# Patient Record
Sex: Male | Born: 1950
Health system: Southern US, Community
[De-identification: ages and names within clinical notes are randomized; demographics above are authoritative.]

## PROBLEM LIST (undated history)

## (undated) HISTORY — PX: COLON SURGERY: SHX602

## (undated) HISTORY — PX: FRACTURE SURGERY: SHX138

---

## 2015-04-14 DIAGNOSIS — M75102 Unspecified rotator cuff tear or rupture of left shoulder, not specified as traumatic: Secondary | ICD-10-CM | POA: Diagnosis not present

## 2015-04-19 DIAGNOSIS — Z8249 Family history of ischemic heart disease and other diseases of the circulatory system: Secondary | ICD-10-CM | POA: Diagnosis not present

## 2015-04-19 DIAGNOSIS — K219 Gastro-esophageal reflux disease without esophagitis: Secondary | ICD-10-CM | POA: Diagnosis present

## 2015-04-19 DIAGNOSIS — N400 Enlarged prostate without lower urinary tract symptoms: Secondary | ICD-10-CM | POA: Diagnosis present

## 2015-04-19 DIAGNOSIS — F319 Bipolar disorder, unspecified: Secondary | ICD-10-CM | POA: Diagnosis not present

## 2015-04-19 DIAGNOSIS — Z79899 Other long term (current) drug therapy: Secondary | ICD-10-CM | POA: Diagnosis not present

## 2015-04-19 DIAGNOSIS — Z79891 Long term (current) use of opiate analgesic: Secondary | ICD-10-CM | POA: Diagnosis not present

## 2015-04-19 DIAGNOSIS — K5720 Diverticulitis of large intestine with perforation and abscess without bleeding: Secondary | ICD-10-CM | POA: Diagnosis present

## 2015-04-19 DIAGNOSIS — F419 Anxiety disorder, unspecified: Secondary | ICD-10-CM | POA: Diagnosis present

## 2015-04-19 DIAGNOSIS — Z880 Allergy status to penicillin: Secondary | ICD-10-CM | POA: Diagnosis not present

## 2015-04-19 DIAGNOSIS — K5732 Diverticulitis of large intestine without perforation or abscess without bleeding: Secondary | ICD-10-CM | POA: Diagnosis not present

## 2015-04-19 DIAGNOSIS — F17220 Nicotine dependence, chewing tobacco, uncomplicated: Secondary | ICD-10-CM | POA: Diagnosis present

## 2015-04-19 DIAGNOSIS — R1031 Right lower quadrant pain: Secondary | ICD-10-CM | POA: Diagnosis not present

## 2015-04-19 DIAGNOSIS — Z7982 Long term (current) use of aspirin: Secondary | ICD-10-CM | POA: Diagnosis not present

## 2015-04-19 DIAGNOSIS — M47897 Other spondylosis, lumbosacral region: Secondary | ICD-10-CM | POA: Diagnosis present

## 2015-04-19 DIAGNOSIS — G4730 Sleep apnea, unspecified: Secondary | ICD-10-CM | POA: Diagnosis present

## 2015-04-24 DIAGNOSIS — K5720 Diverticulitis of large intestine with perforation and abscess without bleeding: Secondary | ICD-10-CM | POA: Diagnosis not present

## 2015-04-24 DIAGNOSIS — K5730 Diverticulosis of large intestine without perforation or abscess without bleeding: Secondary | ICD-10-CM | POA: Diagnosis not present

## 2015-04-25 DIAGNOSIS — K5720 Diverticulitis of large intestine with perforation and abscess without bleeding: Secondary | ICD-10-CM | POA: Diagnosis not present

## 2015-04-25 DIAGNOSIS — K5730 Diverticulosis of large intestine without perforation or abscess without bleeding: Secondary | ICD-10-CM | POA: Diagnosis not present

## 2015-04-26 DIAGNOSIS — K5730 Diverticulosis of large intestine without perforation or abscess without bleeding: Secondary | ICD-10-CM | POA: Diagnosis not present

## 2015-04-26 DIAGNOSIS — K5720 Diverticulitis of large intestine with perforation and abscess without bleeding: Secondary | ICD-10-CM | POA: Diagnosis not present

## 2015-04-27 DIAGNOSIS — K5720 Diverticulitis of large intestine with perforation and abscess without bleeding: Secondary | ICD-10-CM | POA: Diagnosis not present

## 2015-04-27 DIAGNOSIS — K5730 Diverticulosis of large intestine without perforation or abscess without bleeding: Secondary | ICD-10-CM | POA: Diagnosis not present

## 2015-04-28 DIAGNOSIS — K5730 Diverticulosis of large intestine without perforation or abscess without bleeding: Secondary | ICD-10-CM | POA: Diagnosis not present

## 2015-04-28 DIAGNOSIS — K5720 Diverticulitis of large intestine with perforation and abscess without bleeding: Secondary | ICD-10-CM | POA: Diagnosis not present

## 2015-04-29 DIAGNOSIS — K631 Perforation of intestine (nontraumatic): Secondary | ICD-10-CM | POA: Diagnosis not present

## 2015-04-29 DIAGNOSIS — K651 Peritoneal abscess: Secondary | ICD-10-CM | POA: Diagnosis not present

## 2015-04-30 DIAGNOSIS — Z09 Encounter for follow-up examination after completed treatment for conditions other than malignant neoplasm: Secondary | ICD-10-CM | POA: Diagnosis not present

## 2015-04-30 DIAGNOSIS — E7800 Pure hypercholesterolemia, unspecified: Secondary | ICD-10-CM | POA: Diagnosis not present

## 2015-04-30 DIAGNOSIS — K5720 Diverticulitis of large intestine with perforation and abscess without bleeding: Secondary | ICD-10-CM | POA: Diagnosis not present

## 2015-04-30 DIAGNOSIS — K5730 Diverticulosis of large intestine without perforation or abscess without bleeding: Secondary | ICD-10-CM | POA: Diagnosis not present

## 2015-04-30 DIAGNOSIS — I10 Essential (primary) hypertension: Secondary | ICD-10-CM | POA: Diagnosis not present

## 2015-05-04 DIAGNOSIS — K5720 Diverticulitis of large intestine with perforation and abscess without bleeding: Secondary | ICD-10-CM | POA: Diagnosis not present

## 2015-05-13 DIAGNOSIS — M47816 Spondylosis without myelopathy or radiculopathy, lumbar region: Secondary | ICD-10-CM | POA: Diagnosis not present

## 2015-05-13 DIAGNOSIS — Z79899 Other long term (current) drug therapy: Secondary | ICD-10-CM | POA: Diagnosis not present

## 2015-05-13 DIAGNOSIS — M25511 Pain in right shoulder: Secondary | ICD-10-CM | POA: Diagnosis not present

## 2015-05-13 DIAGNOSIS — M533 Sacrococcygeal disorders, not elsewhere classified: Secondary | ICD-10-CM | POA: Diagnosis not present

## 2015-05-13 DIAGNOSIS — G8929 Other chronic pain: Secondary | ICD-10-CM | POA: Diagnosis not present

## 2015-05-13 DIAGNOSIS — Z5181 Encounter for therapeutic drug level monitoring: Secondary | ICD-10-CM | POA: Diagnosis not present

## 2015-05-14 DIAGNOSIS — K5732 Diverticulitis of large intestine without perforation or abscess without bleeding: Secondary | ICD-10-CM | POA: Diagnosis not present

## 2015-05-14 DIAGNOSIS — K4020 Bilateral inguinal hernia, without obstruction or gangrene, not specified as recurrent: Secondary | ICD-10-CM | POA: Diagnosis not present

## 2015-05-14 DIAGNOSIS — K5720 Diverticulitis of large intestine with perforation and abscess without bleeding: Secondary | ICD-10-CM | POA: Diagnosis not present

## 2015-05-20 DIAGNOSIS — K5720 Diverticulitis of large intestine with perforation and abscess without bleeding: Secondary | ICD-10-CM | POA: Diagnosis not present

## 2015-05-25 DIAGNOSIS — Z79899 Other long term (current) drug therapy: Secondary | ICD-10-CM | POA: Diagnosis not present

## 2015-05-25 DIAGNOSIS — N400 Enlarged prostate without lower urinary tract symptoms: Secondary | ICD-10-CM | POA: Diagnosis not present

## 2015-05-25 DIAGNOSIS — K5732 Diverticulitis of large intestine without perforation or abscess without bleeding: Secondary | ICD-10-CM | POA: Diagnosis not present

## 2015-05-25 DIAGNOSIS — F419 Anxiety disorder, unspecified: Secondary | ICD-10-CM | POA: Diagnosis not present

## 2015-05-25 DIAGNOSIS — Z880 Allergy status to penicillin: Secondary | ICD-10-CM | POA: Diagnosis not present

## 2015-05-25 DIAGNOSIS — K5720 Diverticulitis of large intestine with perforation and abscess without bleeding: Secondary | ICD-10-CM | POA: Diagnosis not present

## 2015-05-25 DIAGNOSIS — K219 Gastro-esophageal reflux disease without esophagitis: Secondary | ICD-10-CM | POA: Diagnosis not present

## 2015-05-25 DIAGNOSIS — F17290 Nicotine dependence, other tobacco product, uncomplicated: Secondary | ICD-10-CM | POA: Diagnosis not present

## 2015-05-26 DIAGNOSIS — F3341 Major depressive disorder, recurrent, in partial remission: Secondary | ICD-10-CM | POA: Diagnosis not present

## 2015-05-28 DIAGNOSIS — K5730 Diverticulosis of large intestine without perforation or abscess without bleeding: Secondary | ICD-10-CM | POA: Diagnosis not present

## 2015-05-28 DIAGNOSIS — Z720 Tobacco use: Secondary | ICD-10-CM | POA: Diagnosis not present

## 2015-05-28 DIAGNOSIS — L739 Follicular disorder, unspecified: Secondary | ICD-10-CM | POA: Diagnosis not present

## 2015-05-28 DIAGNOSIS — M545 Low back pain: Secondary | ICD-10-CM | POA: Diagnosis not present

## 2015-06-02 DIAGNOSIS — N529 Male erectile dysfunction, unspecified: Secondary | ICD-10-CM | POA: Diagnosis not present

## 2015-06-03 DIAGNOSIS — R6889 Other general symptoms and signs: Secondary | ICD-10-CM | POA: Diagnosis not present

## 2015-06-09 DIAGNOSIS — E291 Testicular hypofunction: Secondary | ICD-10-CM | POA: Diagnosis not present

## 2015-06-09 DIAGNOSIS — J209 Acute bronchitis, unspecified: Secondary | ICD-10-CM | POA: Diagnosis not present

## 2015-06-16 DIAGNOSIS — F5221 Male erectile disorder: Secondary | ICD-10-CM | POA: Diagnosis not present

## 2015-06-23 DIAGNOSIS — J069 Acute upper respiratory infection, unspecified: Secondary | ICD-10-CM | POA: Diagnosis not present

## 2015-06-23 DIAGNOSIS — M1990 Unspecified osteoarthritis, unspecified site: Secondary | ICD-10-CM | POA: Diagnosis not present

## 2015-06-23 DIAGNOSIS — M545 Low back pain: Secondary | ICD-10-CM | POA: Diagnosis not present

## 2015-06-23 DIAGNOSIS — F5221 Male erectile disorder: Secondary | ICD-10-CM | POA: Diagnosis not present

## 2015-07-07 DIAGNOSIS — N529 Male erectile dysfunction, unspecified: Secondary | ICD-10-CM | POA: Diagnosis not present

## 2015-07-13 DIAGNOSIS — M533 Sacrococcygeal disorders, not elsewhere classified: Secondary | ICD-10-CM | POA: Diagnosis not present

## 2015-07-21 DIAGNOSIS — N529 Male erectile dysfunction, unspecified: Secondary | ICD-10-CM | POA: Diagnosis not present

## 2015-07-29 DIAGNOSIS — R1030 Lower abdominal pain, unspecified: Secondary | ICD-10-CM | POA: Diagnosis not present

## 2015-07-29 DIAGNOSIS — Z79899 Other long term (current) drug therapy: Secondary | ICD-10-CM | POA: Diagnosis not present

## 2015-07-29 DIAGNOSIS — K5732 Diverticulitis of large intestine without perforation or abscess without bleeding: Secondary | ICD-10-CM | POA: Diagnosis not present

## 2015-07-29 DIAGNOSIS — F419 Anxiety disorder, unspecified: Secondary | ICD-10-CM | POA: Diagnosis not present

## 2015-07-29 DIAGNOSIS — K219 Gastro-esophageal reflux disease without esophagitis: Secondary | ICD-10-CM | POA: Diagnosis not present

## 2015-07-29 DIAGNOSIS — Z7982 Long term (current) use of aspirin: Secondary | ICD-10-CM | POA: Diagnosis not present

## 2015-07-29 DIAGNOSIS — X58XXXA Exposure to other specified factors, initial encounter: Secondary | ICD-10-CM | POA: Diagnosis not present

## 2015-07-29 DIAGNOSIS — S39011A Strain of muscle, fascia and tendon of abdomen, initial encounter: Secondary | ICD-10-CM | POA: Diagnosis not present

## 2015-07-29 DIAGNOSIS — R1084 Generalized abdominal pain: Secondary | ICD-10-CM | POA: Diagnosis not present

## 2015-07-31 DIAGNOSIS — Z7982 Long term (current) use of aspirin: Secondary | ICD-10-CM | POA: Diagnosis not present

## 2015-07-31 DIAGNOSIS — R10814 Left lower quadrant abdominal tenderness: Secondary | ICD-10-CM | POA: Diagnosis not present

## 2015-07-31 DIAGNOSIS — R1030 Lower abdominal pain, unspecified: Secondary | ICD-10-CM | POA: Diagnosis not present

## 2015-07-31 DIAGNOSIS — F419 Anxiety disorder, unspecified: Secondary | ICD-10-CM | POA: Diagnosis not present

## 2015-07-31 DIAGNOSIS — S39011A Strain of muscle, fascia and tendon of abdomen, initial encounter: Secondary | ICD-10-CM | POA: Diagnosis not present

## 2015-07-31 DIAGNOSIS — S39011D Strain of muscle, fascia and tendon of abdomen, subsequent encounter: Secondary | ICD-10-CM | POA: Diagnosis not present

## 2015-07-31 DIAGNOSIS — K2970 Gastritis, unspecified, without bleeding: Secondary | ICD-10-CM | POA: Diagnosis not present

## 2015-07-31 DIAGNOSIS — Z79899 Other long term (current) drug therapy: Secondary | ICD-10-CM | POA: Diagnosis not present

## 2015-07-31 DIAGNOSIS — X501XXD Overexertion from prolonged static or awkward postures, subsequent encounter: Secondary | ICD-10-CM | POA: Diagnosis not present

## 2015-07-31 DIAGNOSIS — K219 Gastro-esophageal reflux disease without esophagitis: Secondary | ICD-10-CM | POA: Diagnosis not present

## 2015-08-03 DIAGNOSIS — Z299 Encounter for prophylactic measures, unspecified: Secondary | ICD-10-CM | POA: Diagnosis not present

## 2015-08-03 DIAGNOSIS — K5792 Diverticulitis of intestine, part unspecified, without perforation or abscess without bleeding: Secondary | ICD-10-CM | POA: Diagnosis not present

## 2015-08-03 DIAGNOSIS — R1032 Left lower quadrant pain: Secondary | ICD-10-CM | POA: Diagnosis not present

## 2015-08-03 DIAGNOSIS — E291 Testicular hypofunction: Secondary | ICD-10-CM | POA: Diagnosis not present

## 2015-08-11 DIAGNOSIS — E291 Testicular hypofunction: Secondary | ICD-10-CM | POA: Diagnosis not present

## 2015-08-17 DIAGNOSIS — E7800 Pure hypercholesterolemia, unspecified: Secondary | ICD-10-CM | POA: Diagnosis not present

## 2015-08-17 DIAGNOSIS — I10 Essential (primary) hypertension: Secondary | ICD-10-CM | POA: Diagnosis not present

## 2015-08-17 DIAGNOSIS — M159 Polyosteoarthritis, unspecified: Secondary | ICD-10-CM | POA: Diagnosis not present

## 2015-08-18 DIAGNOSIS — F3341 Major depressive disorder, recurrent, in partial remission: Secondary | ICD-10-CM | POA: Diagnosis not present

## 2015-08-18 DIAGNOSIS — E291 Testicular hypofunction: Secondary | ICD-10-CM | POA: Diagnosis not present

## 2015-08-25 DIAGNOSIS — E291 Testicular hypofunction: Secondary | ICD-10-CM | POA: Diagnosis not present

## 2015-09-01 DIAGNOSIS — E291 Testicular hypofunction: Secondary | ICD-10-CM | POA: Diagnosis not present

## 2015-09-15 DIAGNOSIS — E291 Testicular hypofunction: Secondary | ICD-10-CM | POA: Diagnosis not present

## 2015-09-21 DIAGNOSIS — M159 Polyosteoarthritis, unspecified: Secondary | ICD-10-CM | POA: Diagnosis not present

## 2015-09-21 DIAGNOSIS — E7800 Pure hypercholesterolemia, unspecified: Secondary | ICD-10-CM | POA: Diagnosis not present

## 2015-09-21 DIAGNOSIS — I10 Essential (primary) hypertension: Secondary | ICD-10-CM | POA: Diagnosis not present

## 2015-09-29 DIAGNOSIS — K219 Gastro-esophageal reflux disease without esophagitis: Secondary | ICD-10-CM | POA: Diagnosis not present

## 2015-09-29 DIAGNOSIS — M1990 Unspecified osteoarthritis, unspecified site: Secondary | ICD-10-CM | POA: Diagnosis not present

## 2015-09-29 DIAGNOSIS — E291 Testicular hypofunction: Secondary | ICD-10-CM | POA: Diagnosis not present

## 2015-09-29 DIAGNOSIS — L739 Follicular disorder, unspecified: Secondary | ICD-10-CM | POA: Diagnosis not present

## 2015-09-29 DIAGNOSIS — B356 Tinea cruris: Secondary | ICD-10-CM | POA: Diagnosis not present

## 2015-10-07 DIAGNOSIS — K5732 Diverticulitis of large intestine without perforation or abscess without bleeding: Secondary | ICD-10-CM | POA: Diagnosis present

## 2015-10-07 DIAGNOSIS — Z7982 Long term (current) use of aspirin: Secondary | ICD-10-CM | POA: Diagnosis not present

## 2015-10-07 DIAGNOSIS — D72829 Elevated white blood cell count, unspecified: Secondary | ICD-10-CM | POA: Diagnosis present

## 2015-10-07 DIAGNOSIS — Z880 Allergy status to penicillin: Secondary | ICD-10-CM | POA: Diagnosis not present

## 2015-10-07 DIAGNOSIS — Z8701 Personal history of pneumonia (recurrent): Secondary | ICD-10-CM | POA: Diagnosis not present

## 2015-10-07 DIAGNOSIS — K632 Fistula of intestine: Secondary | ICD-10-CM | POA: Diagnosis present

## 2015-10-07 DIAGNOSIS — R1084 Generalized abdominal pain: Secondary | ICD-10-CM | POA: Diagnosis not present

## 2015-10-07 DIAGNOSIS — M47897 Other spondylosis, lumbosacral region: Secondary | ICD-10-CM | POA: Diagnosis present

## 2015-10-07 DIAGNOSIS — G4730 Sleep apnea, unspecified: Secondary | ICD-10-CM | POA: Diagnosis not present

## 2015-10-07 DIAGNOSIS — F319 Bipolar disorder, unspecified: Secondary | ICD-10-CM | POA: Diagnosis present

## 2015-10-07 DIAGNOSIS — K760 Fatty (change of) liver, not elsewhere classified: Secondary | ICD-10-CM | POA: Diagnosis not present

## 2015-10-07 DIAGNOSIS — Z79899 Other long term (current) drug therapy: Secondary | ICD-10-CM | POA: Diagnosis not present

## 2015-10-07 DIAGNOSIS — Z79891 Long term (current) use of opiate analgesic: Secondary | ICD-10-CM | POA: Diagnosis not present

## 2015-10-07 DIAGNOSIS — R112 Nausea with vomiting, unspecified: Secondary | ICD-10-CM | POA: Diagnosis not present

## 2015-10-07 DIAGNOSIS — K5730 Diverticulosis of large intestine without perforation or abscess without bleeding: Secondary | ICD-10-CM | POA: Diagnosis not present

## 2015-10-07 DIAGNOSIS — F17220 Nicotine dependence, chewing tobacco, uncomplicated: Secondary | ICD-10-CM | POA: Diagnosis present

## 2015-10-07 DIAGNOSIS — F17200 Nicotine dependence, unspecified, uncomplicated: Secondary | ICD-10-CM | POA: Diagnosis not present

## 2015-10-16 DIAGNOSIS — E291 Testicular hypofunction: Secondary | ICD-10-CM | POA: Diagnosis not present

## 2015-10-16 DIAGNOSIS — Z09 Encounter for follow-up examination after completed treatment for conditions other than malignant neoplasm: Secondary | ICD-10-CM | POA: Diagnosis not present

## 2015-10-16 DIAGNOSIS — Z299 Encounter for prophylactic measures, unspecified: Secondary | ICD-10-CM | POA: Diagnosis not present

## 2015-10-16 DIAGNOSIS — K5792 Diverticulitis of intestine, part unspecified, without perforation or abscess without bleeding: Secondary | ICD-10-CM | POA: Diagnosis not present

## 2015-10-20 DIAGNOSIS — K5732 Diverticulitis of large intestine without perforation or abscess without bleeding: Secondary | ICD-10-CM | POA: Diagnosis not present

## 2015-10-20 DIAGNOSIS — E291 Testicular hypofunction: Secondary | ICD-10-CM | POA: Diagnosis not present

## 2015-10-27 DIAGNOSIS — K5732 Diverticulitis of large intestine without perforation or abscess without bleeding: Secondary | ICD-10-CM | POA: Diagnosis not present

## 2015-10-27 DIAGNOSIS — Z6826 Body mass index (BMI) 26.0-26.9, adult: Secondary | ICD-10-CM | POA: Diagnosis not present

## 2015-10-27 DIAGNOSIS — M533 Sacrococcygeal disorders, not elsewhere classified: Secondary | ICD-10-CM | POA: Diagnosis not present

## 2015-10-27 DIAGNOSIS — Z713 Dietary counseling and surveillance: Secondary | ICD-10-CM | POA: Diagnosis not present

## 2015-10-27 DIAGNOSIS — Z299 Encounter for prophylactic measures, unspecified: Secondary | ICD-10-CM | POA: Diagnosis not present

## 2015-11-03 DIAGNOSIS — F319 Bipolar disorder, unspecified: Secondary | ICD-10-CM | POA: Diagnosis not present

## 2015-11-03 DIAGNOSIS — J449 Chronic obstructive pulmonary disease, unspecified: Secondary | ICD-10-CM | POA: Diagnosis not present

## 2015-11-03 DIAGNOSIS — K5732 Diverticulitis of large intestine without perforation or abscess without bleeding: Secondary | ICD-10-CM | POA: Diagnosis not present

## 2015-11-09 DIAGNOSIS — F3341 Major depressive disorder, recurrent, in partial remission: Secondary | ICD-10-CM | POA: Diagnosis not present

## 2015-11-17 DIAGNOSIS — E291 Testicular hypofunction: Secondary | ICD-10-CM | POA: Diagnosis not present

## 2015-12-01 DIAGNOSIS — E291 Testicular hypofunction: Secondary | ICD-10-CM | POA: Diagnosis not present

## 2015-12-15 DIAGNOSIS — E291 Testicular hypofunction: Secondary | ICD-10-CM | POA: Diagnosis not present

## 2015-12-29 DIAGNOSIS — K5720 Diverticulitis of large intestine with perforation and abscess without bleeding: Secondary | ICD-10-CM | POA: Diagnosis not present

## 2015-12-29 DIAGNOSIS — K912 Postsurgical malabsorption, not elsewhere classified: Secondary | ICD-10-CM | POA: Diagnosis not present

## 2015-12-29 DIAGNOSIS — E291 Testicular hypofunction: Secondary | ICD-10-CM | POA: Diagnosis not present

## 2015-12-30 DIAGNOSIS — E291 Testicular hypofunction: Secondary | ICD-10-CM | POA: Diagnosis not present

## 2016-01-05 DIAGNOSIS — Z6828 Body mass index (BMI) 28.0-28.9, adult: Secondary | ICD-10-CM | POA: Diagnosis not present

## 2016-01-05 DIAGNOSIS — Z713 Dietary counseling and surveillance: Secondary | ICD-10-CM | POA: Diagnosis not present

## 2016-01-05 DIAGNOSIS — F17200 Nicotine dependence, unspecified, uncomplicated: Secondary | ICD-10-CM | POA: Diagnosis not present

## 2016-01-05 DIAGNOSIS — Z720 Tobacco use: Secondary | ICD-10-CM | POA: Diagnosis not present

## 2016-01-05 DIAGNOSIS — J069 Acute upper respiratory infection, unspecified: Secondary | ICD-10-CM | POA: Diagnosis not present

## 2016-01-12 DIAGNOSIS — E291 Testicular hypofunction: Secondary | ICD-10-CM | POA: Diagnosis not present

## 2016-01-13 DIAGNOSIS — G4733 Obstructive sleep apnea (adult) (pediatric): Secondary | ICD-10-CM | POA: Diagnosis not present

## 2016-01-13 DIAGNOSIS — K5720 Diverticulitis of large intestine with perforation and abscess without bleeding: Secondary | ICD-10-CM | POA: Diagnosis not present

## 2016-01-13 DIAGNOSIS — K660 Peritoneal adhesions (postprocedural) (postinfection): Secondary | ICD-10-CM | POA: Diagnosis not present

## 2016-01-13 DIAGNOSIS — G8929 Other chronic pain: Secondary | ICD-10-CM | POA: Diagnosis not present

## 2016-01-13 DIAGNOSIS — M549 Dorsalgia, unspecified: Secondary | ICD-10-CM | POA: Diagnosis not present

## 2016-01-13 DIAGNOSIS — K219 Gastro-esophageal reflux disease without esophagitis: Secondary | ICD-10-CM | POA: Diagnosis not present

## 2016-01-13 DIAGNOSIS — Z79899 Other long term (current) drug therapy: Secondary | ICD-10-CM | POA: Diagnosis not present

## 2016-01-13 DIAGNOSIS — Z7982 Long term (current) use of aspirin: Secondary | ICD-10-CM | POA: Diagnosis not present

## 2016-01-20 DIAGNOSIS — Z79899 Other long term (current) drug therapy: Secondary | ICD-10-CM | POA: Diagnosis not present

## 2016-01-20 DIAGNOSIS — Z5181 Encounter for therapeutic drug level monitoring: Secondary | ICD-10-CM | POA: Diagnosis not present

## 2016-01-20 DIAGNOSIS — M47816 Spondylosis without myelopathy or radiculopathy, lumbar region: Secondary | ICD-10-CM | POA: Diagnosis not present

## 2016-01-20 DIAGNOSIS — M25511 Pain in right shoulder: Secondary | ICD-10-CM | POA: Diagnosis not present

## 2016-01-20 DIAGNOSIS — M533 Sacrococcygeal disorders, not elsewhere classified: Secondary | ICD-10-CM | POA: Diagnosis not present

## 2016-01-20 DIAGNOSIS — G894 Chronic pain syndrome: Secondary | ICD-10-CM | POA: Diagnosis not present

## 2016-01-25 DIAGNOSIS — M549 Dorsalgia, unspecified: Secondary | ICD-10-CM | POA: Diagnosis present

## 2016-01-25 DIAGNOSIS — G4733 Obstructive sleep apnea (adult) (pediatric): Secondary | ICD-10-CM | POA: Diagnosis present

## 2016-01-25 DIAGNOSIS — R590 Localized enlarged lymph nodes: Secondary | ICD-10-CM | POA: Diagnosis not present

## 2016-01-25 DIAGNOSIS — K219 Gastro-esophageal reflux disease without esophagitis: Secondary | ICD-10-CM | POA: Diagnosis present

## 2016-01-25 DIAGNOSIS — Z79899 Other long term (current) drug therapy: Secondary | ICD-10-CM | POA: Diagnosis not present

## 2016-01-25 DIAGNOSIS — G8929 Other chronic pain: Secondary | ICD-10-CM | POA: Diagnosis present

## 2016-01-25 DIAGNOSIS — K660 Peritoneal adhesions (postprocedural) (postinfection): Secondary | ICD-10-CM | POA: Diagnosis present

## 2016-01-25 DIAGNOSIS — G8918 Other acute postprocedural pain: Secondary | ICD-10-CM | POA: Diagnosis not present

## 2016-01-25 DIAGNOSIS — K5720 Diverticulitis of large intestine with perforation and abscess without bleeding: Secondary | ICD-10-CM | POA: Diagnosis present

## 2016-01-25 DIAGNOSIS — Z7982 Long term (current) use of aspirin: Secondary | ICD-10-CM | POA: Diagnosis not present

## 2016-02-10 DIAGNOSIS — E291 Testicular hypofunction: Secondary | ICD-10-CM | POA: Diagnosis not present

## 2016-02-11 ENCOUNTER — Emergency Department (HOSPITAL_COMMUNITY): Admission: EM | Admit: 2016-02-11 | Payer: Self-pay | Source: Home / Self Care

## 2016-02-11 ENCOUNTER — Emergency Department (HOSPITAL_COMMUNITY): Payer: Medicare Other

## 2016-02-11 ENCOUNTER — Encounter (HOSPITAL_COMMUNITY): Payer: Self-pay | Admitting: Emergency Medicine

## 2016-02-11 ENCOUNTER — Other Ambulatory Visit (HOSPITAL_COMMUNITY): Payer: Self-pay | Admitting: Pharmacy Technician

## 2016-02-11 ENCOUNTER — Emergency Department (HOSPITAL_COMMUNITY)
Admission: EM | Admit: 2016-02-11 | Discharge: 2016-02-11 | Disposition: A | Discharge status: Home / Self Care | Payer: Medicare Other | Attending: Emergency Medicine | Admitting: Emergency Medicine

## 2016-02-11 DIAGNOSIS — Y92410 Unspecified street and highway as the place of occurrence of the external cause: Secondary | ICD-10-CM | POA: Insufficient documentation

## 2016-02-11 DIAGNOSIS — R079 Chest pain, unspecified: Secondary | ICD-10-CM

## 2016-02-11 DIAGNOSIS — Y9389 Activity, other specified: Secondary | ICD-10-CM | POA: Insufficient documentation

## 2016-02-11 DIAGNOSIS — S161XXA Strain of muscle, fascia and tendon at neck level, initial encounter: Secondary | ICD-10-CM

## 2016-02-11 DIAGNOSIS — S199XXA Unspecified injury of neck, initial encounter: Secondary | ICD-10-CM | POA: Diagnosis not present

## 2016-02-11 DIAGNOSIS — Y999 Unspecified external cause status: Secondary | ICD-10-CM | POA: Insufficient documentation

## 2016-02-11 NOTE — Discharge Instructions (Signed)
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
## 2016-02-11 NOTE — ED Notes (Signed)
Pt made aware to return if symptoms worsen or if any life threatening symptoms occur.   
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
## 2016-02-16 DIAGNOSIS — R5383 Other fatigue: Secondary | ICD-10-CM | POA: Diagnosis not present

## 2016-02-16 DIAGNOSIS — Z299 Encounter for prophylactic measures, unspecified: Secondary | ICD-10-CM | POA: Diagnosis not present

## 2016-02-16 DIAGNOSIS — Z0000 Encounter for general adult medical examination without abnormal findings: Secondary | ICD-10-CM | POA: Diagnosis not present

## 2016-02-16 DIAGNOSIS — Z7189 Other specified counseling: Secondary | ICD-10-CM | POA: Diagnosis not present

## 2016-02-16 DIAGNOSIS — Z1389 Encounter for screening for other disorder: Secondary | ICD-10-CM | POA: Diagnosis not present

## 2016-02-16 DIAGNOSIS — E7800 Pure hypercholesterolemia, unspecified: Secondary | ICD-10-CM | POA: Diagnosis not present

## 2016-02-16 DIAGNOSIS — E349 Endocrine disorder, unspecified: Secondary | ICD-10-CM | POA: Diagnosis not present

## 2016-02-16 DIAGNOSIS — Z125 Encounter for screening for malignant neoplasm of prostate: Secondary | ICD-10-CM | POA: Diagnosis not present

## 2016-02-16 DIAGNOSIS — J069 Acute upper respiratory infection, unspecified: Secondary | ICD-10-CM | POA: Diagnosis not present

## 2016-02-17 DIAGNOSIS — Z48815 Encounter for surgical aftercare following surgery on the digestive system: Secondary | ICD-10-CM | POA: Diagnosis not present

## 2016-02-17 DIAGNOSIS — Z8719 Personal history of other diseases of the digestive system: Secondary | ICD-10-CM | POA: Diagnosis not present

## 2016-02-17 DIAGNOSIS — Z9049 Acquired absence of other specified parts of digestive tract: Secondary | ICD-10-CM | POA: Diagnosis not present

## 2016-02-19 DIAGNOSIS — E349 Endocrine disorder, unspecified: Secondary | ICD-10-CM | POA: Diagnosis not present

## 2016-02-19 DIAGNOSIS — N529 Male erectile dysfunction, unspecified: Secondary | ICD-10-CM | POA: Diagnosis not present

## 2016-02-19 DIAGNOSIS — F319 Bipolar disorder, unspecified: Secondary | ICD-10-CM | POA: Diagnosis not present

## 2016-02-19 DIAGNOSIS — K219 Gastro-esophageal reflux disease without esophagitis: Secondary | ICD-10-CM | POA: Diagnosis not present

## 2016-02-24 DIAGNOSIS — M25511 Pain in right shoulder: Secondary | ICD-10-CM | POA: Diagnosis not present

## 2016-02-24 DIAGNOSIS — M533 Sacrococcygeal disorders, not elsewhere classified: Secondary | ICD-10-CM | POA: Diagnosis not present

## 2016-02-24 DIAGNOSIS — G894 Chronic pain syndrome: Secondary | ICD-10-CM | POA: Diagnosis not present

## 2016-02-24 DIAGNOSIS — M47816 Spondylosis without myelopathy or radiculopathy, lumbar region: Secondary | ICD-10-CM | POA: Diagnosis not present

## 2016-03-01 DIAGNOSIS — E349 Endocrine disorder, unspecified: Secondary | ICD-10-CM | POA: Diagnosis not present

## 2016-03-15 DIAGNOSIS — E349 Endocrine disorder, unspecified: Secondary | ICD-10-CM | POA: Diagnosis not present

## 2016-03-18 DIAGNOSIS — Z789 Other specified health status: Secondary | ICD-10-CM | POA: Diagnosis not present

## 2016-03-18 DIAGNOSIS — J069 Acute upper respiratory infection, unspecified: Secondary | ICD-10-CM | POA: Diagnosis not present

## 2016-03-23 DIAGNOSIS — M47816 Spondylosis without myelopathy or radiculopathy, lumbar region: Secondary | ICD-10-CM | POA: Diagnosis not present

## 2016-03-23 DIAGNOSIS — G894 Chronic pain syndrome: Secondary | ICD-10-CM | POA: Diagnosis not present

## 2016-03-23 DIAGNOSIS — M25511 Pain in right shoulder: Secondary | ICD-10-CM | POA: Diagnosis not present

## 2016-03-23 DIAGNOSIS — M533 Sacrococcygeal disorders, not elsewhere classified: Secondary | ICD-10-CM | POA: Diagnosis not present

## 2016-03-29 DIAGNOSIS — E349 Endocrine disorder, unspecified: Secondary | ICD-10-CM | POA: Diagnosis not present

## 2016-04-12 DIAGNOSIS — E349 Endocrine disorder, unspecified: Secondary | ICD-10-CM | POA: Diagnosis not present

## 2016-04-14 DIAGNOSIS — J441 Chronic obstructive pulmonary disease with (acute) exacerbation: Secondary | ICD-10-CM | POA: Diagnosis not present

## 2016-04-14 DIAGNOSIS — Z299 Encounter for prophylactic measures, unspecified: Secondary | ICD-10-CM | POA: Diagnosis not present

## 2016-04-14 DIAGNOSIS — F319 Bipolar disorder, unspecified: Secondary | ICD-10-CM | POA: Diagnosis not present

## 2016-04-14 DIAGNOSIS — Z6829 Body mass index (BMI) 29.0-29.9, adult: Secondary | ICD-10-CM | POA: Diagnosis not present

## 2016-04-14 DIAGNOSIS — Z789 Other specified health status: Secondary | ICD-10-CM | POA: Diagnosis not present

## 2016-04-20 DIAGNOSIS — M533 Sacrococcygeal disorders, not elsewhere classified: Secondary | ICD-10-CM | POA: Diagnosis not present

## 2016-05-02 DIAGNOSIS — R05 Cough: Secondary | ICD-10-CM | POA: Diagnosis not present

## 2016-05-02 DIAGNOSIS — J069 Acute upper respiratory infection, unspecified: Secondary | ICD-10-CM | POA: Diagnosis not present

## 2016-05-02 DIAGNOSIS — R509 Fever, unspecified: Secondary | ICD-10-CM | POA: Diagnosis not present

## 2016-05-02 DIAGNOSIS — E291 Testicular hypofunction: Secondary | ICD-10-CM | POA: Diagnosis not present

## 2016-05-03 DIAGNOSIS — F329 Major depressive disorder, single episode, unspecified: Secondary | ICD-10-CM | POA: Diagnosis not present

## 2016-05-17 DIAGNOSIS — J209 Acute bronchitis, unspecified: Secondary | ICD-10-CM | POA: Diagnosis not present

## 2016-05-17 DIAGNOSIS — Z789 Other specified health status: Secondary | ICD-10-CM | POA: Diagnosis not present

## 2016-05-17 DIAGNOSIS — Z713 Dietary counseling and surveillance: Secondary | ICD-10-CM | POA: Diagnosis not present

## 2016-05-17 DIAGNOSIS — Z299 Encounter for prophylactic measures, unspecified: Secondary | ICD-10-CM | POA: Diagnosis not present

## 2016-05-17 DIAGNOSIS — B356 Tinea cruris: Secondary | ICD-10-CM | POA: Diagnosis not present

## 2016-05-17 DIAGNOSIS — Z6828 Body mass index (BMI) 28.0-28.9, adult: Secondary | ICD-10-CM | POA: Diagnosis not present

## 2016-05-24 DIAGNOSIS — E349 Endocrine disorder, unspecified: Secondary | ICD-10-CM | POA: Diagnosis not present

## 2016-06-09 DIAGNOSIS — E349 Endocrine disorder, unspecified: Secondary | ICD-10-CM | POA: Diagnosis not present

## 2016-06-13 DIAGNOSIS — M159 Polyosteoarthritis, unspecified: Secondary | ICD-10-CM | POA: Diagnosis not present

## 2016-06-13 DIAGNOSIS — I10 Essential (primary) hypertension: Secondary | ICD-10-CM | POA: Diagnosis not present

## 2016-06-13 DIAGNOSIS — E7800 Pure hypercholesterolemia, unspecified: Secondary | ICD-10-CM | POA: Diagnosis not present

## 2016-06-28 DIAGNOSIS — E349 Endocrine disorder, unspecified: Secondary | ICD-10-CM | POA: Diagnosis not present

## 2016-07-12 DIAGNOSIS — E349 Endocrine disorder, unspecified: Secondary | ICD-10-CM | POA: Diagnosis not present

## 2016-07-18 DIAGNOSIS — I10 Essential (primary) hypertension: Secondary | ICD-10-CM | POA: Diagnosis not present

## 2016-07-18 DIAGNOSIS — M159 Polyosteoarthritis, unspecified: Secondary | ICD-10-CM | POA: Diagnosis not present

## 2016-07-18 DIAGNOSIS — E7800 Pure hypercholesterolemia, unspecified: Secondary | ICD-10-CM | POA: Diagnosis not present

## 2016-07-20 DIAGNOSIS — M533 Sacrococcygeal disorders, not elsewhere classified: Secondary | ICD-10-CM | POA: Diagnosis not present

## 2016-07-26 DIAGNOSIS — F329 Major depressive disorder, single episode, unspecified: Secondary | ICD-10-CM | POA: Diagnosis not present

## 2016-07-26 DIAGNOSIS — E349 Endocrine disorder, unspecified: Secondary | ICD-10-CM | POA: Diagnosis not present

## 2016-08-09 DIAGNOSIS — E349 Endocrine disorder, unspecified: Secondary | ICD-10-CM | POA: Diagnosis not present

## 2016-08-23 DIAGNOSIS — E349 Endocrine disorder, unspecified: Secondary | ICD-10-CM | POA: Diagnosis not present

## 2016-09-07 DIAGNOSIS — E349 Endocrine disorder, unspecified: Secondary | ICD-10-CM | POA: Diagnosis not present

## 2016-09-22 DIAGNOSIS — E349 Endocrine disorder, unspecified: Secondary | ICD-10-CM | POA: Diagnosis not present

## 2016-09-27 DIAGNOSIS — I10 Essential (primary) hypertension: Secondary | ICD-10-CM | POA: Diagnosis not present

## 2016-09-27 DIAGNOSIS — M159 Polyosteoarthritis, unspecified: Secondary | ICD-10-CM | POA: Diagnosis not present

## 2016-09-27 DIAGNOSIS — E7800 Pure hypercholesterolemia, unspecified: Secondary | ICD-10-CM | POA: Diagnosis not present

## 2016-10-11 DIAGNOSIS — E349 Endocrine disorder, unspecified: Secondary | ICD-10-CM | POA: Diagnosis not present

## 2016-10-19 DIAGNOSIS — Z5181 Encounter for therapeutic drug level monitoring: Secondary | ICD-10-CM | POA: Diagnosis not present

## 2016-10-19 DIAGNOSIS — M533 Sacrococcygeal disorders, not elsewhere classified: Secondary | ICD-10-CM | POA: Diagnosis not present

## 2016-10-19 DIAGNOSIS — Z79899 Other long term (current) drug therapy: Secondary | ICD-10-CM | POA: Diagnosis not present

## 2016-10-25 DIAGNOSIS — E349 Endocrine disorder, unspecified: Secondary | ICD-10-CM | POA: Diagnosis not present

## 2016-10-25 DIAGNOSIS — F329 Major depressive disorder, single episode, unspecified: Secondary | ICD-10-CM | POA: Diagnosis not present

## 2016-11-08 DIAGNOSIS — E349 Endocrine disorder, unspecified: Secondary | ICD-10-CM | POA: Diagnosis not present

## 2016-11-22 DIAGNOSIS — E349 Endocrine disorder, unspecified: Secondary | ICD-10-CM | POA: Diagnosis not present

## 2016-11-23 DIAGNOSIS — Z713 Dietary counseling and surveillance: Secondary | ICD-10-CM | POA: Diagnosis not present

## 2016-11-23 DIAGNOSIS — F319 Bipolar disorder, unspecified: Secondary | ICD-10-CM | POA: Diagnosis not present

## 2016-11-23 DIAGNOSIS — J449 Chronic obstructive pulmonary disease, unspecified: Secondary | ICD-10-CM | POA: Diagnosis not present

## 2016-11-23 DIAGNOSIS — N400 Enlarged prostate without lower urinary tract symptoms: Secondary | ICD-10-CM | POA: Diagnosis not present

## 2016-11-23 DIAGNOSIS — E7800 Pure hypercholesterolemia, unspecified: Secondary | ICD-10-CM | POA: Diagnosis not present

## 2016-11-23 DIAGNOSIS — G4700 Insomnia, unspecified: Secondary | ICD-10-CM | POA: Diagnosis not present

## 2016-11-23 DIAGNOSIS — Z299 Encounter for prophylactic measures, unspecified: Secondary | ICD-10-CM | POA: Diagnosis not present

## 2016-11-23 DIAGNOSIS — Z6829 Body mass index (BMI) 29.0-29.9, adult: Secondary | ICD-10-CM | POA: Diagnosis not present

## 2016-11-23 DIAGNOSIS — E349 Endocrine disorder, unspecified: Secondary | ICD-10-CM | POA: Diagnosis not present

## 2016-11-29 DIAGNOSIS — J449 Chronic obstructive pulmonary disease, unspecified: Secondary | ICD-10-CM | POA: Diagnosis not present

## 2016-11-29 DIAGNOSIS — J069 Acute upper respiratory infection, unspecified: Secondary | ICD-10-CM | POA: Diagnosis not present

## 2016-11-29 DIAGNOSIS — Z6829 Body mass index (BMI) 29.0-29.9, adult: Secondary | ICD-10-CM | POA: Diagnosis not present

## 2016-11-29 DIAGNOSIS — F319 Bipolar disorder, unspecified: Secondary | ICD-10-CM | POA: Diagnosis not present

## 2016-11-29 DIAGNOSIS — G4700 Insomnia, unspecified: Secondary | ICD-10-CM | POA: Diagnosis not present

## 2016-11-29 DIAGNOSIS — Z299 Encounter for prophylactic measures, unspecified: Secondary | ICD-10-CM | POA: Diagnosis not present

## 2016-11-29 DIAGNOSIS — N400 Enlarged prostate without lower urinary tract symptoms: Secondary | ICD-10-CM | POA: Diagnosis not present

## 2016-11-29 DIAGNOSIS — E7800 Pure hypercholesterolemia, unspecified: Secondary | ICD-10-CM | POA: Diagnosis not present

## 2016-11-29 DIAGNOSIS — E349 Endocrine disorder, unspecified: Secondary | ICD-10-CM | POA: Diagnosis not present

## 2016-12-07 DIAGNOSIS — E7800 Pure hypercholesterolemia, unspecified: Secondary | ICD-10-CM | POA: Diagnosis not present

## 2016-12-07 DIAGNOSIS — F319 Bipolar disorder, unspecified: Secondary | ICD-10-CM | POA: Diagnosis not present

## 2016-12-07 DIAGNOSIS — E349 Endocrine disorder, unspecified: Secondary | ICD-10-CM | POA: Diagnosis not present

## 2016-12-07 DIAGNOSIS — Z299 Encounter for prophylactic measures, unspecified: Secondary | ICD-10-CM | POA: Diagnosis not present

## 2016-12-07 DIAGNOSIS — Z202 Contact with and (suspected) exposure to infections with a predominantly sexual mode of transmission: Secondary | ICD-10-CM | POA: Diagnosis not present

## 2016-12-07 DIAGNOSIS — Z6829 Body mass index (BMI) 29.0-29.9, adult: Secondary | ICD-10-CM | POA: Diagnosis not present

## 2016-12-07 DIAGNOSIS — N400 Enlarged prostate without lower urinary tract symptoms: Secondary | ICD-10-CM | POA: Diagnosis not present

## 2016-12-07 DIAGNOSIS — R350 Frequency of micturition: Secondary | ICD-10-CM | POA: Diagnosis not present

## 2016-12-07 DIAGNOSIS — J449 Chronic obstructive pulmonary disease, unspecified: Secondary | ICD-10-CM | POA: Diagnosis not present

## 2016-12-07 DIAGNOSIS — K5730 Diverticulosis of large intestine without perforation or abscess without bleeding: Secondary | ICD-10-CM | POA: Diagnosis not present

## 2016-12-15 DIAGNOSIS — E7800 Pure hypercholesterolemia, unspecified: Secondary | ICD-10-CM | POA: Diagnosis not present

## 2016-12-15 DIAGNOSIS — I10 Essential (primary) hypertension: Secondary | ICD-10-CM | POA: Diagnosis not present

## 2016-12-15 DIAGNOSIS — M159 Polyosteoarthritis, unspecified: Secondary | ICD-10-CM | POA: Diagnosis not present

## 2016-12-21 DIAGNOSIS — E7800 Pure hypercholesterolemia, unspecified: Secondary | ICD-10-CM | POA: Diagnosis not present

## 2016-12-21 DIAGNOSIS — F319 Bipolar disorder, unspecified: Secondary | ICD-10-CM | POA: Diagnosis not present

## 2016-12-21 DIAGNOSIS — J449 Chronic obstructive pulmonary disease, unspecified: Secondary | ICD-10-CM | POA: Diagnosis not present

## 2016-12-21 DIAGNOSIS — Z6829 Body mass index (BMI) 29.0-29.9, adult: Secondary | ICD-10-CM | POA: Diagnosis not present

## 2016-12-21 DIAGNOSIS — Z713 Dietary counseling and surveillance: Secondary | ICD-10-CM | POA: Diagnosis not present

## 2016-12-21 DIAGNOSIS — N400 Enlarged prostate without lower urinary tract symptoms: Secondary | ICD-10-CM | POA: Diagnosis not present

## 2016-12-21 DIAGNOSIS — Z299 Encounter for prophylactic measures, unspecified: Secondary | ICD-10-CM | POA: Diagnosis not present

## 2016-12-21 DIAGNOSIS — E349 Endocrine disorder, unspecified: Secondary | ICD-10-CM | POA: Diagnosis not present

## 2016-12-21 DIAGNOSIS — R350 Frequency of micturition: Secondary | ICD-10-CM | POA: Diagnosis not present

## 2016-12-30 DIAGNOSIS — F329 Major depressive disorder, single episode, unspecified: Secondary | ICD-10-CM | POA: Diagnosis not present

## 2017-01-04 DIAGNOSIS — Z299 Encounter for prophylactic measures, unspecified: Secondary | ICD-10-CM | POA: Diagnosis not present

## 2017-01-04 DIAGNOSIS — E349 Endocrine disorder, unspecified: Secondary | ICD-10-CM | POA: Diagnosis not present

## 2017-01-17 DIAGNOSIS — E349 Endocrine disorder, unspecified: Secondary | ICD-10-CM | POA: Diagnosis not present

## 2017-01-18 DIAGNOSIS — E7800 Pure hypercholesterolemia, unspecified: Secondary | ICD-10-CM | POA: Diagnosis not present

## 2017-01-18 DIAGNOSIS — I10 Essential (primary) hypertension: Secondary | ICD-10-CM | POA: Diagnosis not present

## 2017-01-18 DIAGNOSIS — M159 Polyosteoarthritis, unspecified: Secondary | ICD-10-CM | POA: Diagnosis not present

## 2017-01-31 DIAGNOSIS — E349 Endocrine disorder, unspecified: Secondary | ICD-10-CM | POA: Diagnosis not present

## 2017-02-06 DIAGNOSIS — M47816 Spondylosis without myelopathy or radiculopathy, lumbar region: Secondary | ICD-10-CM | POA: Diagnosis not present

## 2017-02-14 DIAGNOSIS — E349 Endocrine disorder, unspecified: Secondary | ICD-10-CM | POA: Diagnosis not present

## 2017-02-24 DIAGNOSIS — Z1331 Encounter for screening for depression: Secondary | ICD-10-CM | POA: Diagnosis not present

## 2017-02-24 DIAGNOSIS — Z1211 Encounter for screening for malignant neoplasm of colon: Secondary | ICD-10-CM | POA: Diagnosis not present

## 2017-02-24 DIAGNOSIS — J069 Acute upper respiratory infection, unspecified: Secondary | ICD-10-CM | POA: Diagnosis not present

## 2017-02-24 DIAGNOSIS — Z7189 Other specified counseling: Secondary | ICD-10-CM | POA: Diagnosis not present

## 2017-02-24 DIAGNOSIS — Z79899 Other long term (current) drug therapy: Secondary | ICD-10-CM | POA: Diagnosis not present

## 2017-02-24 DIAGNOSIS — Z6829 Body mass index (BMI) 29.0-29.9, adult: Secondary | ICD-10-CM | POA: Diagnosis not present

## 2017-02-24 DIAGNOSIS — Z0000 Encounter for general adult medical examination without abnormal findings: Secondary | ICD-10-CM | POA: Diagnosis not present

## 2017-02-24 DIAGNOSIS — Z125 Encounter for screening for malignant neoplasm of prostate: Secondary | ICD-10-CM | POA: Diagnosis not present

## 2017-02-24 DIAGNOSIS — Z299 Encounter for prophylactic measures, unspecified: Secondary | ICD-10-CM | POA: Diagnosis not present

## 2017-02-24 DIAGNOSIS — R5383 Other fatigue: Secondary | ICD-10-CM | POA: Diagnosis not present

## 2017-02-24 DIAGNOSIS — Z1339 Encounter for screening examination for other mental health and behavioral disorders: Secondary | ICD-10-CM | POA: Diagnosis not present

## 2017-02-28 DIAGNOSIS — E349 Endocrine disorder, unspecified: Secondary | ICD-10-CM | POA: Diagnosis not present

## 2017-03-14 DIAGNOSIS — E349 Endocrine disorder, unspecified: Secondary | ICD-10-CM | POA: Diagnosis not present

## 2017-03-28 DIAGNOSIS — E349 Endocrine disorder, unspecified: Secondary | ICD-10-CM | POA: Diagnosis not present

## 2017-03-28 DIAGNOSIS — Z6830 Body mass index (BMI) 30.0-30.9, adult: Secondary | ICD-10-CM | POA: Diagnosis not present

## 2017-03-28 DIAGNOSIS — Z713 Dietary counseling and surveillance: Secondary | ICD-10-CM | POA: Diagnosis not present

## 2017-03-28 DIAGNOSIS — Z299 Encounter for prophylactic measures, unspecified: Secondary | ICD-10-CM | POA: Diagnosis not present

## 2017-03-28 DIAGNOSIS — J449 Chronic obstructive pulmonary disease, unspecified: Secondary | ICD-10-CM | POA: Diagnosis not present

## 2017-04-12 DIAGNOSIS — E349 Endocrine disorder, unspecified: Secondary | ICD-10-CM | POA: Diagnosis not present

## 2017-04-21 DIAGNOSIS — F3341 Major depressive disorder, recurrent, in partial remission: Secondary | ICD-10-CM | POA: Diagnosis not present

## 2017-04-25 DIAGNOSIS — E349 Endocrine disorder, unspecified: Secondary | ICD-10-CM | POA: Diagnosis not present

## 2017-05-09 DIAGNOSIS — Z79899 Other long term (current) drug therapy: Secondary | ICD-10-CM | POA: Diagnosis not present

## 2017-05-09 DIAGNOSIS — M533 Sacrococcygeal disorders, not elsewhere classified: Secondary | ICD-10-CM | POA: Diagnosis not present

## 2017-05-09 DIAGNOSIS — Z5181 Encounter for therapeutic drug level monitoring: Secondary | ICD-10-CM | POA: Diagnosis not present

## 2017-05-16 DIAGNOSIS — E349 Endocrine disorder, unspecified: Secondary | ICD-10-CM | POA: Diagnosis not present

## 2017-05-30 DIAGNOSIS — J449 Chronic obstructive pulmonary disease, unspecified: Secondary | ICD-10-CM | POA: Diagnosis not present

## 2017-05-30 DIAGNOSIS — Z6830 Body mass index (BMI) 30.0-30.9, adult: Secondary | ICD-10-CM | POA: Diagnosis not present

## 2017-05-30 DIAGNOSIS — E7800 Pure hypercholesterolemia, unspecified: Secondary | ICD-10-CM | POA: Diagnosis not present

## 2017-05-30 DIAGNOSIS — Z299 Encounter for prophylactic measures, unspecified: Secondary | ICD-10-CM | POA: Diagnosis not present

## 2017-05-30 DIAGNOSIS — J069 Acute upper respiratory infection, unspecified: Secondary | ICD-10-CM | POA: Diagnosis not present

## 2017-05-30 DIAGNOSIS — F319 Bipolar disorder, unspecified: Secondary | ICD-10-CM | POA: Diagnosis not present

## 2017-05-30 DIAGNOSIS — K5730 Diverticulosis of large intestine without perforation or abscess without bleeding: Secondary | ICD-10-CM | POA: Diagnosis not present

## 2017-05-30 DIAGNOSIS — E349 Endocrine disorder, unspecified: Secondary | ICD-10-CM | POA: Diagnosis not present

## 2017-06-13 DIAGNOSIS — E349 Endocrine disorder, unspecified: Secondary | ICD-10-CM | POA: Diagnosis not present

## 2017-06-27 DIAGNOSIS — E349 Endocrine disorder, unspecified: Secondary | ICD-10-CM | POA: Diagnosis not present

## 2017-07-11 DIAGNOSIS — E349 Endocrine disorder, unspecified: Secondary | ICD-10-CM | POA: Diagnosis not present

## 2017-07-25 DIAGNOSIS — E349 Endocrine disorder, unspecified: Secondary | ICD-10-CM | POA: Diagnosis not present

## 2017-08-07 DIAGNOSIS — M47816 Spondylosis without myelopathy or radiculopathy, lumbar region: Secondary | ICD-10-CM | POA: Diagnosis not present

## 2017-08-08 DIAGNOSIS — L409 Psoriasis, unspecified: Secondary | ICD-10-CM | POA: Diagnosis not present

## 2017-08-08 DIAGNOSIS — J449 Chronic obstructive pulmonary disease, unspecified: Secondary | ICD-10-CM | POA: Diagnosis not present

## 2017-08-08 DIAGNOSIS — E349 Endocrine disorder, unspecified: Secondary | ICD-10-CM | POA: Diagnosis not present

## 2017-08-11 DIAGNOSIS — F3341 Major depressive disorder, recurrent, in partial remission: Secondary | ICD-10-CM | POA: Diagnosis not present

## 2017-08-23 DIAGNOSIS — E349 Endocrine disorder, unspecified: Secondary | ICD-10-CM | POA: Diagnosis not present

## 2017-09-06 DIAGNOSIS — E349 Endocrine disorder, unspecified: Secondary | ICD-10-CM | POA: Diagnosis not present

## 2017-09-20 DIAGNOSIS — E349 Endocrine disorder, unspecified: Secondary | ICD-10-CM | POA: Diagnosis not present

## 2017-10-04 DIAGNOSIS — Z299 Encounter for prophylactic measures, unspecified: Secondary | ICD-10-CM | POA: Diagnosis not present

## 2017-10-04 DIAGNOSIS — E349 Endocrine disorder, unspecified: Secondary | ICD-10-CM | POA: Diagnosis not present

## 2017-10-18 DIAGNOSIS — E349 Endocrine disorder, unspecified: Secondary | ICD-10-CM | POA: Diagnosis not present

## 2017-11-06 DIAGNOSIS — M47816 Spondylosis without myelopathy or radiculopathy, lumbar region: Secondary | ICD-10-CM | POA: Diagnosis not present

## 2017-11-06 DIAGNOSIS — Z5181 Encounter for therapeutic drug level monitoring: Secondary | ICD-10-CM | POA: Diagnosis not present

## 2017-11-06 DIAGNOSIS — Z79899 Other long term (current) drug therapy: Secondary | ICD-10-CM | POA: Diagnosis not present

## 2017-11-08 DIAGNOSIS — E349 Endocrine disorder, unspecified: Secondary | ICD-10-CM | POA: Diagnosis not present

## 2017-11-23 DIAGNOSIS — E349 Endocrine disorder, unspecified: Secondary | ICD-10-CM | POA: Diagnosis not present

## 2017-12-06 DIAGNOSIS — E349 Endocrine disorder, unspecified: Secondary | ICD-10-CM | POA: Diagnosis not present

## 2017-12-20 DIAGNOSIS — E349 Endocrine disorder, unspecified: Secondary | ICD-10-CM | POA: Diagnosis not present

## 2018-01-03 DIAGNOSIS — E349 Endocrine disorder, unspecified: Secondary | ICD-10-CM | POA: Diagnosis not present

## 2018-01-17 DIAGNOSIS — E349 Endocrine disorder, unspecified: Secondary | ICD-10-CM | POA: Diagnosis not present

## 2018-01-31 DIAGNOSIS — E349 Endocrine disorder, unspecified: Secondary | ICD-10-CM | POA: Diagnosis not present

## 2018-02-05 DIAGNOSIS — M47816 Spondylosis without myelopathy or radiculopathy, lumbar region: Secondary | ICD-10-CM | POA: Diagnosis not present

## 2018-02-14 DIAGNOSIS — E349 Endocrine disorder, unspecified: Secondary | ICD-10-CM | POA: Diagnosis not present

## 2018-02-28 DIAGNOSIS — E349 Endocrine disorder, unspecified: Secondary | ICD-10-CM | POA: Diagnosis not present

## 2018-03-12 DIAGNOSIS — Z0000 Encounter for general adult medical examination without abnormal findings: Secondary | ICD-10-CM | POA: Diagnosis not present

## 2018-03-12 DIAGNOSIS — Z7189 Other specified counseling: Secondary | ICD-10-CM | POA: Diagnosis not present

## 2018-03-12 DIAGNOSIS — Z1211 Encounter for screening for malignant neoplasm of colon: Secondary | ICD-10-CM | POA: Diagnosis not present

## 2018-03-12 DIAGNOSIS — E7800 Pure hypercholesterolemia, unspecified: Secondary | ICD-10-CM | POA: Diagnosis not present

## 2018-03-12 DIAGNOSIS — Z79899 Other long term (current) drug therapy: Secondary | ICD-10-CM | POA: Diagnosis not present

## 2018-03-12 DIAGNOSIS — F319 Bipolar disorder, unspecified: Secondary | ICD-10-CM | POA: Diagnosis not present

## 2018-03-12 DIAGNOSIS — Z125 Encounter for screening for malignant neoplasm of prostate: Secondary | ICD-10-CM | POA: Diagnosis not present

## 2018-03-12 DIAGNOSIS — Z299 Encounter for prophylactic measures, unspecified: Secondary | ICD-10-CM | POA: Diagnosis not present

## 2018-03-12 DIAGNOSIS — Z1331 Encounter for screening for depression: Secondary | ICD-10-CM | POA: Diagnosis not present

## 2018-03-12 DIAGNOSIS — Z6830 Body mass index (BMI) 30.0-30.9, adult: Secondary | ICD-10-CM | POA: Diagnosis not present

## 2018-03-12 DIAGNOSIS — R5383 Other fatigue: Secondary | ICD-10-CM | POA: Diagnosis not present

## 2018-03-12 DIAGNOSIS — Z1339 Encounter for screening examination for other mental health and behavioral disorders: Secondary | ICD-10-CM | POA: Diagnosis not present

## 2018-03-14 DIAGNOSIS — E349 Endocrine disorder, unspecified: Secondary | ICD-10-CM | POA: Diagnosis not present

## 2018-03-28 DIAGNOSIS — E349 Endocrine disorder, unspecified: Secondary | ICD-10-CM | POA: Diagnosis not present

## 2018-04-10 DIAGNOSIS — E349 Endocrine disorder, unspecified: Secondary | ICD-10-CM | POA: Diagnosis not present

## 2018-04-25 DIAGNOSIS — E349 Endocrine disorder, unspecified: Secondary | ICD-10-CM | POA: Diagnosis not present

## 2018-05-08 DIAGNOSIS — Z79899 Other long term (current) drug therapy: Secondary | ICD-10-CM | POA: Diagnosis not present

## 2018-05-08 DIAGNOSIS — Z5181 Encounter for therapeutic drug level monitoring: Secondary | ICD-10-CM | POA: Diagnosis not present

## 2018-05-08 DIAGNOSIS — M533 Sacrococcygeal disorders, not elsewhere classified: Secondary | ICD-10-CM | POA: Diagnosis not present

## 2018-05-09 DIAGNOSIS — E349 Endocrine disorder, unspecified: Secondary | ICD-10-CM | POA: Diagnosis not present

## 2018-05-22 DIAGNOSIS — J449 Chronic obstructive pulmonary disease, unspecified: Secondary | ICD-10-CM | POA: Diagnosis not present

## 2018-05-22 DIAGNOSIS — R21 Rash and other nonspecific skin eruption: Secondary | ICD-10-CM | POA: Diagnosis not present

## 2018-05-22 DIAGNOSIS — Z299 Encounter for prophylactic measures, unspecified: Secondary | ICD-10-CM | POA: Diagnosis not present

## 2018-05-22 DIAGNOSIS — B356 Tinea cruris: Secondary | ICD-10-CM | POA: Diagnosis not present

## 2018-05-22 DIAGNOSIS — Z789 Other specified health status: Secondary | ICD-10-CM | POA: Diagnosis not present

## 2018-05-22 DIAGNOSIS — E349 Endocrine disorder, unspecified: Secondary | ICD-10-CM | POA: Diagnosis not present

## 2018-05-22 DIAGNOSIS — Z6831 Body mass index (BMI) 31.0-31.9, adult: Secondary | ICD-10-CM | POA: Diagnosis not present

## 2018-06-07 DIAGNOSIS — E349 Endocrine disorder, unspecified: Secondary | ICD-10-CM | POA: Diagnosis not present

## 2018-06-20 DIAGNOSIS — E349 Endocrine disorder, unspecified: Secondary | ICD-10-CM | POA: Diagnosis not present

## 2018-07-04 DIAGNOSIS — E349 Endocrine disorder, unspecified: Secondary | ICD-10-CM | POA: Diagnosis not present

## 2018-08-01 DIAGNOSIS — E349 Endocrine disorder, unspecified: Secondary | ICD-10-CM | POA: Diagnosis not present

## 2018-08-07 DIAGNOSIS — M47816 Spondylosis without myelopathy or radiculopathy, lumbar region: Secondary | ICD-10-CM | POA: Diagnosis not present

## 2018-08-15 DIAGNOSIS — E349 Endocrine disorder, unspecified: Secondary | ICD-10-CM | POA: Diagnosis not present

## 2018-08-29 DIAGNOSIS — E349 Endocrine disorder, unspecified: Secondary | ICD-10-CM | POA: Diagnosis not present

## 2018-09-12 DIAGNOSIS — E349 Endocrine disorder, unspecified: Secondary | ICD-10-CM | POA: Diagnosis not present

## 2018-09-26 DIAGNOSIS — E349 Endocrine disorder, unspecified: Secondary | ICD-10-CM | POA: Diagnosis not present

## 2018-09-26 DIAGNOSIS — Z299 Encounter for prophylactic measures, unspecified: Secondary | ICD-10-CM | POA: Diagnosis not present

## 2018-09-26 DIAGNOSIS — F319 Bipolar disorder, unspecified: Secondary | ICD-10-CM | POA: Diagnosis not present

## 2018-09-26 DIAGNOSIS — J449 Chronic obstructive pulmonary disease, unspecified: Secondary | ICD-10-CM | POA: Diagnosis not present

## 2018-09-26 DIAGNOSIS — J019 Acute sinusitis, unspecified: Secondary | ICD-10-CM | POA: Diagnosis not present

## 2018-09-26 DIAGNOSIS — Z6831 Body mass index (BMI) 31.0-31.9, adult: Secondary | ICD-10-CM | POA: Diagnosis not present

## 2018-10-02 DIAGNOSIS — C44311 Basal cell carcinoma of skin of nose: Secondary | ICD-10-CM | POA: Diagnosis not present

## 2018-10-02 DIAGNOSIS — D485 Neoplasm of uncertain behavior of skin: Secondary | ICD-10-CM | POA: Diagnosis not present

## 2018-10-02 DIAGNOSIS — L570 Actinic keratosis: Secondary | ICD-10-CM | POA: Diagnosis not present

## 2018-10-02 DIAGNOSIS — L819 Disorder of pigmentation, unspecified: Secondary | ICD-10-CM | POA: Diagnosis not present

## 2018-10-10 DIAGNOSIS — Z299 Encounter for prophylactic measures, unspecified: Secondary | ICD-10-CM | POA: Diagnosis not present

## 2018-10-10 DIAGNOSIS — E349 Endocrine disorder, unspecified: Secondary | ICD-10-CM | POA: Diagnosis not present

## 2018-10-18 DIAGNOSIS — C44311 Basal cell carcinoma of skin of nose: Secondary | ICD-10-CM | POA: Diagnosis not present
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
## 2018-10-24 DIAGNOSIS — E349 Endocrine disorder, unspecified: Secondary | ICD-10-CM | POA: Diagnosis not present

## 2018-10-30 DIAGNOSIS — N419 Inflammatory disease of prostate, unspecified: Secondary | ICD-10-CM | POA: Diagnosis not present

## 2018-10-30 DIAGNOSIS — R350 Frequency of micturition: Secondary | ICD-10-CM | POA: Diagnosis not present

## 2018-10-30 DIAGNOSIS — Z6832 Body mass index (BMI) 32.0-32.9, adult: Secondary | ICD-10-CM | POA: Diagnosis not present

## 2018-10-30 DIAGNOSIS — Z299 Encounter for prophylactic measures, unspecified: Secondary | ICD-10-CM | POA: Diagnosis not present

## 2018-10-30 DIAGNOSIS — F319 Bipolar disorder, unspecified: Secondary | ICD-10-CM | POA: Diagnosis not present

## 2018-10-30 DIAGNOSIS — N529 Male erectile dysfunction, unspecified: Secondary | ICD-10-CM | POA: Diagnosis not present

## 2018-11-06 DIAGNOSIS — M533 Sacrococcygeal disorders, not elsewhere classified: Secondary | ICD-10-CM | POA: Diagnosis not present

## 2018-11-06 DIAGNOSIS — Z79899 Other long term (current) drug therapy: Secondary | ICD-10-CM | POA: Diagnosis not present

## 2018-11-06 DIAGNOSIS — Z5181 Encounter for therapeutic drug level monitoring: Secondary | ICD-10-CM | POA: Diagnosis not present

## 2018-11-07 DIAGNOSIS — E349 Endocrine disorder, unspecified: Secondary | ICD-10-CM | POA: Diagnosis not present

## 2018-11-21 DIAGNOSIS — E349 Endocrine disorder, unspecified: Secondary | ICD-10-CM | POA: Diagnosis not present

## 2018-12-05 DIAGNOSIS — Z299 Encounter for prophylactic measures, unspecified: Secondary | ICD-10-CM | POA: Diagnosis not present

## 2018-12-05 DIAGNOSIS — J449 Chronic obstructive pulmonary disease, unspecified: Secondary | ICD-10-CM | POA: Diagnosis not present

## 2018-12-05 DIAGNOSIS — N529 Male erectile dysfunction, unspecified: Secondary | ICD-10-CM | POA: Diagnosis not present

## 2018-12-05 DIAGNOSIS — Z6832 Body mass index (BMI) 32.0-32.9, adult: Secondary | ICD-10-CM | POA: Diagnosis not present

## 2018-12-05 DIAGNOSIS — F319 Bipolar disorder, unspecified: Secondary | ICD-10-CM | POA: Diagnosis not present

## 2018-12-05 DIAGNOSIS — E349 Endocrine disorder, unspecified: Secondary | ICD-10-CM | POA: Diagnosis not present

## 2018-12-19 DIAGNOSIS — E349 Endocrine disorder, unspecified: Secondary | ICD-10-CM | POA: Diagnosis not present

## 2019-01-02 DIAGNOSIS — E349 Endocrine disorder, unspecified: Secondary | ICD-10-CM | POA: Diagnosis not present

## 2019-01-16 DIAGNOSIS — E349 Endocrine disorder, unspecified: Secondary | ICD-10-CM | POA: Diagnosis not present

## 2019-01-30 DIAGNOSIS — E349 Endocrine disorder, unspecified: Secondary | ICD-10-CM | POA: Diagnosis not present

## 2019-02-05 DIAGNOSIS — M533 Sacrococcygeal disorders, not elsewhere classified: Secondary | ICD-10-CM | POA: Diagnosis not present

## 2019-02-13 DIAGNOSIS — E349 Endocrine disorder, unspecified: Secondary | ICD-10-CM | POA: Diagnosis not present

## 2019-02-19 DIAGNOSIS — D239 Other benign neoplasm of skin, unspecified: Secondary | ICD-10-CM | POA: Diagnosis not present

## 2019-02-19 DIAGNOSIS — Z85828 Personal history of other malignant neoplasm of skin: Secondary | ICD-10-CM | POA: Diagnosis not present

## 2019-02-19 DIAGNOSIS — D485 Neoplasm of uncertain behavior of skin: Secondary | ICD-10-CM | POA: Diagnosis not present

## 2019-02-19 DIAGNOSIS — L570 Actinic keratosis: Secondary | ICD-10-CM | POA: Diagnosis not present

## 2019-02-19 DIAGNOSIS — L821 Other seborrheic keratosis: Secondary | ICD-10-CM | POA: Diagnosis not present

## 2019-02-19 DIAGNOSIS — D225 Melanocytic nevi of trunk: Secondary | ICD-10-CM | POA: Diagnosis not present

## 2019-02-27 DIAGNOSIS — E349 Endocrine disorder, unspecified: Secondary | ICD-10-CM | POA: Diagnosis not present

## 2019-03-13 DIAGNOSIS — E349 Endocrine disorder, unspecified: Secondary | ICD-10-CM | POA: Diagnosis not present

## 2019-03-18 DIAGNOSIS — Z1211 Encounter for screening for malignant neoplasm of colon: Secondary | ICD-10-CM | POA: Diagnosis not present

## 2019-03-18 DIAGNOSIS — R5383 Other fatigue: Secondary | ICD-10-CM | POA: Diagnosis not present

## 2019-03-18 DIAGNOSIS — Z125 Encounter for screening for malignant neoplasm of prostate: Secondary | ICD-10-CM | POA: Diagnosis not present

## 2019-03-18 DIAGNOSIS — Z79899 Other long term (current) drug therapy: Secondary | ICD-10-CM | POA: Diagnosis not present

## 2019-03-18 DIAGNOSIS — Z0000 Encounter for general adult medical examination without abnormal findings: Secondary | ICD-10-CM | POA: Diagnosis not present

## 2019-03-18 DIAGNOSIS — Z6832 Body mass index (BMI) 32.0-32.9, adult: Secondary | ICD-10-CM | POA: Diagnosis not present

## 2019-03-18 DIAGNOSIS — Z299 Encounter for prophylactic measures, unspecified: Secondary | ICD-10-CM | POA: Diagnosis not present

## 2019-03-18 DIAGNOSIS — Z7189 Other specified counseling: Secondary | ICD-10-CM | POA: Diagnosis not present

## 2019-03-18 DIAGNOSIS — Z1339 Encounter for screening examination for other mental health and behavioral disorders: Secondary | ICD-10-CM | POA: Diagnosis not present

## 2019-03-18 DIAGNOSIS — Z1331 Encounter for screening for depression: Secondary | ICD-10-CM | POA: Diagnosis not present

## 2019-03-18 DIAGNOSIS — E7800 Pure hypercholesterolemia, unspecified: Secondary | ICD-10-CM | POA: Diagnosis not present

## 2019-03-18 DIAGNOSIS — J449 Chronic obstructive pulmonary disease, unspecified: Secondary | ICD-10-CM | POA: Diagnosis not present

## 2019-04-24 DIAGNOSIS — E349 Endocrine disorder, unspecified: Secondary | ICD-10-CM | POA: Diagnosis not present

## 2019-05-08 DIAGNOSIS — E349 Endocrine disorder, unspecified: Secondary | ICD-10-CM | POA: Diagnosis not present

## 2019-05-14 DIAGNOSIS — M533 Sacrococcygeal disorders, not elsewhere classified: Secondary | ICD-10-CM | POA: Diagnosis not present

## 2019-05-14 DIAGNOSIS — Z79899 Other long term (current) drug therapy: Secondary | ICD-10-CM | POA: Diagnosis not present

## 2019-05-14 DIAGNOSIS — Z5181 Encounter for therapeutic drug level monitoring: Secondary | ICD-10-CM | POA: Diagnosis not present

## 2019-05-22 DIAGNOSIS — E349 Endocrine disorder, unspecified: Secondary | ICD-10-CM | POA: Diagnosis not present

## 2019-06-05 DIAGNOSIS — E349 Endocrine disorder, unspecified: Secondary | ICD-10-CM | POA: Diagnosis not present

## 2019-06-19 DIAGNOSIS — E349 Endocrine disorder, unspecified: Secondary | ICD-10-CM | POA: Diagnosis not present

## 2019-07-03 DIAGNOSIS — E349 Endocrine disorder, unspecified: Secondary | ICD-10-CM | POA: Diagnosis not present

## 2019-07-17 DIAGNOSIS — E349 Endocrine disorder, unspecified: Secondary | ICD-10-CM | POA: Diagnosis not present

## 2019-07-31 DIAGNOSIS — E349 Endocrine disorder, unspecified: Secondary | ICD-10-CM | POA: Diagnosis not present

## 2019-08-06 DIAGNOSIS — M47816 Spondylosis without myelopathy or radiculopathy, lumbar region: Secondary | ICD-10-CM | POA: Diagnosis not present

## 2019-08-14 DIAGNOSIS — E349 Endocrine disorder, unspecified: Secondary | ICD-10-CM | POA: Diagnosis not present

## 2019-08-28 DIAGNOSIS — E349 Endocrine disorder, unspecified: Secondary | ICD-10-CM | POA: Diagnosis not present

## 2019-09-11 DIAGNOSIS — E349 Endocrine disorder, unspecified: Secondary | ICD-10-CM | POA: Diagnosis not present

## 2019-09-25 DIAGNOSIS — E349 Endocrine disorder, unspecified: Secondary | ICD-10-CM | POA: Diagnosis not present

## 2019-10-23 DIAGNOSIS — E349 Endocrine disorder, unspecified: Secondary | ICD-10-CM | POA: Diagnosis not present

## 2019-11-05 DIAGNOSIS — M47816 Spondylosis without myelopathy or radiculopathy, lumbar region: Secondary | ICD-10-CM | POA: Diagnosis not present

## 2019-11-06 DIAGNOSIS — E349 Endocrine disorder, unspecified: Secondary | ICD-10-CM | POA: Diagnosis not present

## 2019-11-20 DIAGNOSIS — Z299 Encounter for prophylactic measures, unspecified: Secondary | ICD-10-CM | POA: Diagnosis not present

## 2019-11-20 DIAGNOSIS — E349 Endocrine disorder, unspecified: Secondary | ICD-10-CM | POA: Diagnosis not present

## 2019-11-22 DIAGNOSIS — J440 Chronic obstructive pulmonary disease with acute lower respiratory infection: Secondary | ICD-10-CM | POA: Diagnosis not present

## 2019-11-22 DIAGNOSIS — F319 Bipolar disorder, unspecified: Secondary | ICD-10-CM | POA: Diagnosis not present

## 2019-11-22 DIAGNOSIS — Z299 Encounter for prophylactic measures, unspecified: Secondary | ICD-10-CM | POA: Diagnosis not present

## 2019-11-22 DIAGNOSIS — Z6832 Body mass index (BMI) 32.0-32.9, adult: Secondary | ICD-10-CM | POA: Diagnosis not present

## 2019-11-22 DIAGNOSIS — J209 Acute bronchitis, unspecified: Secondary | ICD-10-CM | POA: Diagnosis not present

## 2019-11-22 DIAGNOSIS — J449 Chronic obstructive pulmonary disease, unspecified: Secondary | ICD-10-CM | POA: Diagnosis not present

## 2019-12-04 DIAGNOSIS — Z299 Encounter for prophylactic measures, unspecified: Secondary | ICD-10-CM | POA: Diagnosis not present

## 2019-12-04 DIAGNOSIS — E349 Endocrine disorder, unspecified: Secondary | ICD-10-CM | POA: Diagnosis not present

## 2019-12-18 DIAGNOSIS — E349 Endocrine disorder, unspecified: Secondary | ICD-10-CM | POA: Diagnosis not present

## 2019-12-18 DIAGNOSIS — Z299 Encounter for prophylactic measures, unspecified: Secondary | ICD-10-CM | POA: Diagnosis not present

## 2020-01-01 DIAGNOSIS — Z299 Encounter for prophylactic measures, unspecified: Secondary | ICD-10-CM | POA: Diagnosis not present

## 2020-01-01 DIAGNOSIS — E349 Endocrine disorder, unspecified: Secondary | ICD-10-CM | POA: Diagnosis not present

## 2020-01-15 DIAGNOSIS — E349 Endocrine disorder, unspecified: Secondary | ICD-10-CM | POA: Diagnosis not present

## 2020-01-15 DIAGNOSIS — Z299 Encounter for prophylactic measures, unspecified: Secondary | ICD-10-CM | POA: Diagnosis not present

## 2020-01-29 DIAGNOSIS — Z299 Encounter for prophylactic measures, unspecified: Secondary | ICD-10-CM | POA: Diagnosis not present

## 2020-01-29 DIAGNOSIS — Z2821 Immunization not carried out because of patient refusal: Secondary | ICD-10-CM | POA: Diagnosis not present

## 2020-01-29 DIAGNOSIS — E349 Endocrine disorder, unspecified: Secondary | ICD-10-CM | POA: Diagnosis not present

## 2020-02-06 DIAGNOSIS — M47816 Spondylosis without myelopathy or radiculopathy, lumbar region: Secondary | ICD-10-CM | POA: Diagnosis not present

## 2020-02-06 DIAGNOSIS — Z5181 Encounter for therapeutic drug level monitoring: Secondary | ICD-10-CM | POA: Diagnosis not present

## 2020-02-06 DIAGNOSIS — Z79899 Other long term (current) drug therapy: Secondary | ICD-10-CM | POA: Diagnosis not present

## 2020-02-12 DIAGNOSIS — E349 Endocrine disorder, unspecified: Secondary | ICD-10-CM | POA: Diagnosis not present

## 2020-02-12 DIAGNOSIS — Z299 Encounter for prophylactic measures, unspecified: Secondary | ICD-10-CM | POA: Diagnosis not present

## 2020-02-26 DIAGNOSIS — Z299 Encounter for prophylactic measures, unspecified: Secondary | ICD-10-CM | POA: Diagnosis not present

## 2020-02-26 DIAGNOSIS — E349 Endocrine disorder, unspecified: Secondary | ICD-10-CM | POA: Diagnosis not present

## 2020-03-11 DIAGNOSIS — Z299 Encounter for prophylactic measures, unspecified: Secondary | ICD-10-CM | POA: Diagnosis not present

## 2020-03-11 DIAGNOSIS — E349 Endocrine disorder, unspecified: Secondary | ICD-10-CM | POA: Diagnosis not present

## 2020-03-24 DIAGNOSIS — Z0000 Encounter for general adult medical examination without abnormal findings: Secondary | ICD-10-CM | POA: Diagnosis not present

## 2020-03-24 DIAGNOSIS — Z79899 Other long term (current) drug therapy: Secondary | ICD-10-CM | POA: Diagnosis not present

## 2020-03-24 DIAGNOSIS — F319 Bipolar disorder, unspecified: Secondary | ICD-10-CM | POA: Diagnosis not present

## 2020-03-24 DIAGNOSIS — Z299 Encounter for prophylactic measures, unspecified: Secondary | ICD-10-CM | POA: Diagnosis not present

## 2020-03-24 DIAGNOSIS — R5383 Other fatigue: Secondary | ICD-10-CM | POA: Diagnosis not present

## 2020-03-24 DIAGNOSIS — Z7189 Other specified counseling: Secondary | ICD-10-CM | POA: Diagnosis not present

## 2020-03-24 DIAGNOSIS — Z125 Encounter for screening for malignant neoplasm of prostate: Secondary | ICD-10-CM | POA: Diagnosis not present

## 2020-03-24 DIAGNOSIS — Z1331 Encounter for screening for depression: Secondary | ICD-10-CM | POA: Diagnosis not present

## 2020-03-24 DIAGNOSIS — E7800 Pure hypercholesterolemia, unspecified: Secondary | ICD-10-CM | POA: Diagnosis not present

## 2020-03-24 DIAGNOSIS — E349 Endocrine disorder, unspecified: Secondary | ICD-10-CM | POA: Diagnosis not present

## 2020-03-24 DIAGNOSIS — Z1339 Encounter for screening examination for other mental health and behavioral disorders: Secondary | ICD-10-CM | POA: Diagnosis not present

## 2020-03-24 DIAGNOSIS — Z6831 Body mass index (BMI) 31.0-31.9, adult: Secondary | ICD-10-CM | POA: Diagnosis not present

## 2020-04-08 DIAGNOSIS — Z299 Encounter for prophylactic measures, unspecified: Secondary | ICD-10-CM | POA: Diagnosis not present

## 2020-04-08 DIAGNOSIS — M1990 Unspecified osteoarthritis, unspecified site: Secondary | ICD-10-CM | POA: Diagnosis not present

## 2020-04-08 DIAGNOSIS — Z6831 Body mass index (BMI) 31.0-31.9, adult: Secondary | ICD-10-CM | POA: Diagnosis not present

## 2020-04-08 DIAGNOSIS — E349 Endocrine disorder, unspecified: Secondary | ICD-10-CM | POA: Diagnosis not present

## 2020-04-08 DIAGNOSIS — F319 Bipolar disorder, unspecified: Secondary | ICD-10-CM | POA: Diagnosis not present

## 2020-04-22 DIAGNOSIS — E349 Endocrine disorder, unspecified: Secondary | ICD-10-CM | POA: Diagnosis not present

## 2020-04-22 DIAGNOSIS — Z299 Encounter for prophylactic measures, unspecified: Secondary | ICD-10-CM | POA: Diagnosis not present

## 2020-05-06 DIAGNOSIS — Z79899 Other long term (current) drug therapy: Secondary | ICD-10-CM | POA: Diagnosis not present

## 2020-05-06 DIAGNOSIS — Z880 Allergy status to penicillin: Secondary | ICD-10-CM | POA: Diagnosis not present

## 2020-05-06 DIAGNOSIS — G4730 Sleep apnea, unspecified: Secondary | ICD-10-CM | POA: Diagnosis not present

## 2020-05-06 DIAGNOSIS — J449 Chronic obstructive pulmonary disease, unspecified: Secondary | ICD-10-CM | POA: Diagnosis not present

## 2020-05-06 DIAGNOSIS — F419 Anxiety disorder, unspecified: Secondary | ICD-10-CM | POA: Diagnosis not present

## 2020-05-06 DIAGNOSIS — F32A Depression, unspecified: Secondary | ICD-10-CM | POA: Diagnosis not present

## 2020-05-06 DIAGNOSIS — Z791 Long term (current) use of non-steroidal anti-inflammatories (NSAID): Secondary | ICD-10-CM | POA: Diagnosis not present

## 2020-05-06 DIAGNOSIS — M47816 Spondylosis without myelopathy or radiculopathy, lumbar region: Secondary | ICD-10-CM | POA: Diagnosis not present

## 2020-05-06 DIAGNOSIS — E349 Endocrine disorder, unspecified: Secondary | ICD-10-CM | POA: Diagnosis not present

## 2020-05-20 DIAGNOSIS — E349 Endocrine disorder, unspecified: Secondary | ICD-10-CM | POA: Diagnosis not present

## 2020-06-03 DIAGNOSIS — E349 Endocrine disorder, unspecified: Secondary | ICD-10-CM | POA: Diagnosis not present

## 2020-06-17 DIAGNOSIS — E349 Endocrine disorder, unspecified: Secondary | ICD-10-CM | POA: Diagnosis not present

## 2020-06-24 DIAGNOSIS — M545 Low back pain, unspecified: Secondary | ICD-10-CM | POA: Diagnosis not present

## 2020-06-24 DIAGNOSIS — Z299 Encounter for prophylactic measures, unspecified: Secondary | ICD-10-CM | POA: Diagnosis not present

## 2020-06-24 DIAGNOSIS — J449 Chronic obstructive pulmonary disease, unspecified: Secondary | ICD-10-CM | POA: Diagnosis not present

## 2020-06-24 DIAGNOSIS — F319 Bipolar disorder, unspecified: Secondary | ICD-10-CM | POA: Diagnosis not present

## 2020-06-24 DIAGNOSIS — D582 Other hemoglobinopathies: Secondary | ICD-10-CM | POA: Diagnosis not present

## 2020-07-01 DIAGNOSIS — E349 Endocrine disorder, unspecified: Secondary | ICD-10-CM | POA: Diagnosis not present

## 2020-07-15 DIAGNOSIS — E349 Endocrine disorder, unspecified: Secondary | ICD-10-CM | POA: Diagnosis not present

## 2020-07-28 DIAGNOSIS — E349 Endocrine disorder, unspecified: Secondary | ICD-10-CM | POA: Diagnosis not present

## 2020-08-03 DIAGNOSIS — M961 Postlaminectomy syndrome, not elsewhere classified: Secondary | ICD-10-CM | POA: Diagnosis not present

## 2020-08-03 DIAGNOSIS — G894 Chronic pain syndrome: Secondary | ICD-10-CM | POA: Diagnosis not present

## 2020-08-03 DIAGNOSIS — M545 Low back pain, unspecified: Secondary | ICD-10-CM | POA: Diagnosis not present

## 2020-08-03 DIAGNOSIS — Z5181 Encounter for therapeutic drug level monitoring: Secondary | ICD-10-CM | POA: Diagnosis not present

## 2020-08-03 DIAGNOSIS — G8929 Other chronic pain: Secondary | ICD-10-CM | POA: Diagnosis not present

## 2020-08-03 DIAGNOSIS — M47816 Spondylosis without myelopathy or radiculopathy, lumbar region: Secondary | ICD-10-CM | POA: Diagnosis not present

## 2020-08-03 DIAGNOSIS — Z79899 Other long term (current) drug therapy: Secondary | ICD-10-CM | POA: Diagnosis not present

## 2020-08-03 DIAGNOSIS — M47812 Spondylosis without myelopathy or radiculopathy, cervical region: Secondary | ICD-10-CM | POA: Diagnosis not present

## 2020-08-11 DIAGNOSIS — Z299 Encounter for prophylactic measures, unspecified: Secondary | ICD-10-CM | POA: Diagnosis not present

## 2020-08-12 DIAGNOSIS — E349 Endocrine disorder, unspecified: Secondary | ICD-10-CM | POA: Diagnosis not present

## 2020-08-27 DIAGNOSIS — E349 Endocrine disorder, unspecified: Secondary | ICD-10-CM | POA: Diagnosis not present

## 2020-09-09 DIAGNOSIS — E349 Endocrine disorder, unspecified: Secondary | ICD-10-CM | POA: Diagnosis not present

## 2020-09-23 DIAGNOSIS — E349 Endocrine disorder, unspecified: Secondary | ICD-10-CM | POA: Diagnosis not present

## 2020-09-28 DIAGNOSIS — E876 Hypokalemia: Secondary | ICD-10-CM | POA: Diagnosis not present

## 2020-09-28 DIAGNOSIS — Z299 Encounter for prophylactic measures, unspecified: Secondary | ICD-10-CM | POA: Diagnosis not present

## 2020-09-28 DIAGNOSIS — E349 Endocrine disorder, unspecified: Secondary | ICD-10-CM | POA: Diagnosis not present

## 2020-09-28 DIAGNOSIS — M545 Low back pain, unspecified: Secondary | ICD-10-CM | POA: Diagnosis not present

## 2020-09-28 DIAGNOSIS — K219 Gastro-esophageal reflux disease without esophagitis: Secondary | ICD-10-CM | POA: Diagnosis not present

## 2020-10-08 DIAGNOSIS — J209 Acute bronchitis, unspecified: Secondary | ICD-10-CM | POA: Diagnosis not present

## 2020-10-08 DIAGNOSIS — Z299 Encounter for prophylactic measures, unspecified: Secondary | ICD-10-CM | POA: Diagnosis not present

## 2020-10-08 DIAGNOSIS — E349 Endocrine disorder, unspecified: Secondary | ICD-10-CM | POA: Diagnosis not present

## 2020-10-08 DIAGNOSIS — J440 Chronic obstructive pulmonary disease with acute lower respiratory infection: Secondary | ICD-10-CM | POA: Diagnosis not present

## 2020-10-08 DIAGNOSIS — M545 Low back pain, unspecified: Secondary | ICD-10-CM | POA: Diagnosis not present

## 2020-10-22 DIAGNOSIS — E349 Endocrine disorder, unspecified: Secondary | ICD-10-CM | POA: Diagnosis not present

## 2020-11-02 DIAGNOSIS — Z5181 Encounter for therapeutic drug level monitoring: Secondary | ICD-10-CM | POA: Diagnosis not present

## 2020-11-02 DIAGNOSIS — G8929 Other chronic pain: Secondary | ICD-10-CM | POA: Diagnosis not present

## 2020-11-02 DIAGNOSIS — M47816 Spondylosis without myelopathy or radiculopathy, lumbar region: Secondary | ICD-10-CM | POA: Diagnosis not present

## 2020-11-02 DIAGNOSIS — M47812 Spondylosis without myelopathy or radiculopathy, cervical region: Secondary | ICD-10-CM | POA: Diagnosis not present

## 2020-11-02 DIAGNOSIS — G894 Chronic pain syndrome: Secondary | ICD-10-CM | POA: Diagnosis not present

## 2020-11-02 DIAGNOSIS — Z79899 Other long term (current) drug therapy: Secondary | ICD-10-CM | POA: Diagnosis not present

## 2020-11-02 DIAGNOSIS — M961 Postlaminectomy syndrome, not elsewhere classified: Secondary | ICD-10-CM | POA: Diagnosis not present

## 2020-11-02 DIAGNOSIS — M545 Low back pain, unspecified: Secondary | ICD-10-CM | POA: Diagnosis not present

## 2020-11-04 DIAGNOSIS — E349 Endocrine disorder, unspecified: Secondary | ICD-10-CM | POA: Diagnosis not present

## 2020-11-18 DIAGNOSIS — E349 Endocrine disorder, unspecified: Secondary | ICD-10-CM | POA: Diagnosis not present

## 2020-11-18 DIAGNOSIS — Z880 Allergy status to penicillin: Secondary | ICD-10-CM | POA: Diagnosis not present

## 2020-11-18 DIAGNOSIS — M47816 Spondylosis without myelopathy or radiculopathy, lumbar region: Secondary | ICD-10-CM | POA: Diagnosis not present

## 2020-11-18 DIAGNOSIS — Z79899 Other long term (current) drug therapy: Secondary | ICD-10-CM | POA: Diagnosis not present

## 2020-11-18 DIAGNOSIS — K219 Gastro-esophageal reflux disease without esophagitis: Secondary | ICD-10-CM | POA: Diagnosis not present

## 2020-11-18 DIAGNOSIS — Z7982 Long term (current) use of aspirin: Secondary | ICD-10-CM | POA: Diagnosis not present

## 2020-11-18 DIAGNOSIS — J449 Chronic obstructive pulmonary disease, unspecified: Secondary | ICD-10-CM | POA: Diagnosis not present

## 2020-11-18 DIAGNOSIS — F32A Depression, unspecified: Secondary | ICD-10-CM | POA: Diagnosis not present

## 2020-11-23 DIAGNOSIS — Z299 Encounter for prophylactic measures, unspecified: Secondary | ICD-10-CM | POA: Diagnosis not present

## 2020-11-23 DIAGNOSIS — G4700 Insomnia, unspecified: Secondary | ICD-10-CM | POA: Diagnosis not present

## 2020-11-23 DIAGNOSIS — J449 Chronic obstructive pulmonary disease, unspecified: Secondary | ICD-10-CM | POA: Diagnosis not present

## 2020-11-23 DIAGNOSIS — M545 Low back pain, unspecified: Secondary | ICD-10-CM | POA: Diagnosis not present

## 2020-12-02 DIAGNOSIS — E349 Endocrine disorder, unspecified: Secondary | ICD-10-CM | POA: Diagnosis not present

## 2020-12-16 DIAGNOSIS — E349 Endocrine disorder, unspecified: Secondary | ICD-10-CM | POA: Diagnosis not present

## 2020-12-30 DIAGNOSIS — E349 Endocrine disorder, unspecified: Secondary | ICD-10-CM | POA: Diagnosis not present

## 2021-01-13 DIAGNOSIS — E349 Endocrine disorder, unspecified: Secondary | ICD-10-CM | POA: Diagnosis not present

## 2021-01-27 DIAGNOSIS — E349 Endocrine disorder, unspecified: Secondary | ICD-10-CM | POA: Diagnosis not present

## 2021-02-02 DIAGNOSIS — M47816 Spondylosis without myelopathy or radiculopathy, lumbar region: Secondary | ICD-10-CM | POA: Diagnosis not present

## 2021-02-02 DIAGNOSIS — M545 Low back pain, unspecified: Secondary | ICD-10-CM | POA: Diagnosis not present

## 2021-02-02 DIAGNOSIS — G894 Chronic pain syndrome: Secondary | ICD-10-CM | POA: Diagnosis not present

## 2021-02-02 DIAGNOSIS — M47812 Spondylosis without myelopathy or radiculopathy, cervical region: Secondary | ICD-10-CM | POA: Diagnosis not present

## 2021-02-02 DIAGNOSIS — G8929 Other chronic pain: Secondary | ICD-10-CM | POA: Diagnosis not present

## 2021-02-03 DIAGNOSIS — M545 Low back pain, unspecified: Secondary | ICD-10-CM | POA: Diagnosis not present

## 2021-02-03 DIAGNOSIS — M47817 Spondylosis without myelopathy or radiculopathy, lumbosacral region: Secondary | ICD-10-CM | POA: Diagnosis not present

## 2021-02-03 DIAGNOSIS — G894 Chronic pain syndrome: Secondary | ICD-10-CM | POA: Diagnosis not present

## 2021-02-05 DIAGNOSIS — J449 Chronic obstructive pulmonary disease, unspecified: Secondary | ICD-10-CM | POA: Diagnosis not present

## 2021-02-05 DIAGNOSIS — M545 Low back pain, unspecified: Secondary | ICD-10-CM | POA: Diagnosis not present

## 2021-02-05 DIAGNOSIS — Z299 Encounter for prophylactic measures, unspecified: Secondary | ICD-10-CM | POA: Diagnosis not present

## 2021-02-05 DIAGNOSIS — R52 Pain, unspecified: Secondary | ICD-10-CM | POA: Diagnosis not present

## 2021-02-05 DIAGNOSIS — M5416 Radiculopathy, lumbar region: Secondary | ICD-10-CM | POA: Diagnosis not present

## 2021-02-05 DIAGNOSIS — Z2821 Immunization not carried out because of patient refusal: Secondary | ICD-10-CM | POA: Diagnosis not present

## 2021-02-10 DIAGNOSIS — E349 Endocrine disorder, unspecified: Secondary | ICD-10-CM | POA: Diagnosis not present

## 2021-02-24 DIAGNOSIS — E349 Endocrine disorder, unspecified: Secondary | ICD-10-CM | POA: Diagnosis not present

## 2021-02-25 DIAGNOSIS — M47816 Spondylosis without myelopathy or radiculopathy, lumbar region: Secondary | ICD-10-CM | POA: Diagnosis not present

## 2021-03-10 DIAGNOSIS — E349 Endocrine disorder, unspecified: Secondary | ICD-10-CM | POA: Diagnosis not present

## 2021-03-24 DIAGNOSIS — E349 Endocrine disorder, unspecified: Secondary | ICD-10-CM | POA: Diagnosis not present

## 2021-03-30 DIAGNOSIS — R5383 Other fatigue: Secondary | ICD-10-CM | POA: Diagnosis not present

## 2021-03-30 DIAGNOSIS — Z2821 Immunization not carried out because of patient refusal: Secondary | ICD-10-CM | POA: Diagnosis not present

## 2021-03-30 DIAGNOSIS — E7800 Pure hypercholesterolemia, unspecified: Secondary | ICD-10-CM | POA: Diagnosis not present

## 2021-03-30 DIAGNOSIS — Z7189 Other specified counseling: Secondary | ICD-10-CM | POA: Diagnosis not present

## 2021-03-30 DIAGNOSIS — Z79899 Other long term (current) drug therapy: Secondary | ICD-10-CM | POA: Diagnosis not present

## 2021-03-30 DIAGNOSIS — Z299 Encounter for prophylactic measures, unspecified: Secondary | ICD-10-CM | POA: Diagnosis not present

## 2021-03-30 DIAGNOSIS — Z0000 Encounter for general adult medical examination without abnormal findings: Secondary | ICD-10-CM | POA: Diagnosis not present

## 2021-04-08 DIAGNOSIS — D582 Other hemoglobinopathies: Secondary | ICD-10-CM | POA: Diagnosis not present

## 2021-04-08 DIAGNOSIS — Z299 Encounter for prophylactic measures, unspecified: Secondary | ICD-10-CM | POA: Diagnosis not present

## 2021-04-08 DIAGNOSIS — E349 Endocrine disorder, unspecified: Secondary | ICD-10-CM | POA: Diagnosis not present

## 2021-04-27 DIAGNOSIS — E349 Endocrine disorder, unspecified: Secondary | ICD-10-CM | POA: Diagnosis not present

## 2021-05-03 DIAGNOSIS — M47816 Spondylosis without myelopathy or radiculopathy, lumbar region: Secondary | ICD-10-CM | POA: Diagnosis not present

## 2021-05-03 DIAGNOSIS — G8929 Other chronic pain: Secondary | ICD-10-CM | POA: Diagnosis not present

## 2021-05-03 DIAGNOSIS — M47812 Spondylosis without myelopathy or radiculopathy, cervical region: Secondary | ICD-10-CM | POA: Diagnosis not present

## 2021-05-03 DIAGNOSIS — M545 Low back pain, unspecified: Secondary | ICD-10-CM | POA: Diagnosis not present

## 2021-05-03 DIAGNOSIS — Z5181 Encounter for therapeutic drug level monitoring: Secondary | ICD-10-CM | POA: Diagnosis not present

## 2021-05-03 DIAGNOSIS — Z79899 Other long term (current) drug therapy: Secondary | ICD-10-CM | POA: Diagnosis not present

## 2021-05-28 DIAGNOSIS — E349 Endocrine disorder, unspecified: Secondary | ICD-10-CM | POA: Diagnosis not present

## 2021-06-25 DIAGNOSIS — E349 Endocrine disorder, unspecified: Secondary | ICD-10-CM | POA: Diagnosis not present

## 2021-07-09 DIAGNOSIS — Z7189 Other specified counseling: Secondary | ICD-10-CM | POA: Diagnosis not present

## 2021-07-09 DIAGNOSIS — J449 Chronic obstructive pulmonary disease, unspecified: Secondary | ICD-10-CM | POA: Diagnosis not present

## 2021-07-09 DIAGNOSIS — D582 Other hemoglobinopathies: Secondary | ICD-10-CM | POA: Diagnosis not present

## 2021-07-09 DIAGNOSIS — D692 Other nonthrombocytopenic purpura: Secondary | ICD-10-CM | POA: Diagnosis not present

## 2021-07-09 DIAGNOSIS — Z0000 Encounter for general adult medical examination without abnormal findings: Secondary | ICD-10-CM | POA: Diagnosis not present

## 2021-07-09 DIAGNOSIS — Z299 Encounter for prophylactic measures, unspecified: Secondary | ICD-10-CM | POA: Diagnosis not present

## 2021-07-26 DIAGNOSIS — E349 Endocrine disorder, unspecified: Secondary | ICD-10-CM | POA: Diagnosis not present

## 2021-08-02 DIAGNOSIS — M47812 Spondylosis without myelopathy or radiculopathy, cervical region: Secondary | ICD-10-CM | POA: Diagnosis not present

## 2021-08-02 DIAGNOSIS — M545 Low back pain, unspecified: Secondary | ICD-10-CM | POA: Diagnosis not present

## 2021-08-02 DIAGNOSIS — M961 Postlaminectomy syndrome, not elsewhere classified: Secondary | ICD-10-CM | POA: Diagnosis not present

## 2021-08-02 DIAGNOSIS — G8929 Other chronic pain: Secondary | ICD-10-CM | POA: Diagnosis not present

## 2021-08-02 DIAGNOSIS — M47816 Spondylosis without myelopathy or radiculopathy, lumbar region: Secondary | ICD-10-CM | POA: Diagnosis not present

## 2021-08-25 DIAGNOSIS — E349 Endocrine disorder, unspecified: Secondary | ICD-10-CM | POA: Diagnosis not present

## 2021-09-24 DIAGNOSIS — E349 Endocrine disorder, unspecified: Secondary | ICD-10-CM | POA: Diagnosis not present

## 2021-10-26 DIAGNOSIS — E349 Endocrine disorder, unspecified: Secondary | ICD-10-CM | POA: Diagnosis not present

## 2021-11-01 DIAGNOSIS — M47812 Spondylosis without myelopathy or radiculopathy, cervical region: Secondary | ICD-10-CM | POA: Diagnosis not present

## 2021-11-01 DIAGNOSIS — Z79899 Other long term (current) drug therapy: Secondary | ICD-10-CM | POA: Diagnosis not present

## 2021-11-01 DIAGNOSIS — M545 Low back pain, unspecified: Secondary | ICD-10-CM | POA: Diagnosis not present

## 2021-11-01 DIAGNOSIS — M47816 Spondylosis without myelopathy or radiculopathy, lumbar region: Secondary | ICD-10-CM | POA: Diagnosis not present

## 2021-11-01 DIAGNOSIS — Z5181 Encounter for therapeutic drug level monitoring: Secondary | ICD-10-CM | POA: Diagnosis not present

## 2021-11-15 DIAGNOSIS — M545 Low back pain, unspecified: Secondary | ICD-10-CM | POA: Diagnosis not present

## 2021-11-15 DIAGNOSIS — Z299 Encounter for prophylactic measures, unspecified: Secondary | ICD-10-CM | POA: Diagnosis not present

## 2021-11-15 DIAGNOSIS — R5383 Other fatigue: Secondary | ICD-10-CM | POA: Diagnosis not present

## 2021-11-15 DIAGNOSIS — G4700 Insomnia, unspecified: Secondary | ICD-10-CM | POA: Diagnosis not present

## 2021-11-25 DIAGNOSIS — E349 Endocrine disorder, unspecified: Secondary | ICD-10-CM | POA: Diagnosis not present

## 2021-12-27 DIAGNOSIS — E349 Endocrine disorder, unspecified: Secondary | ICD-10-CM | POA: Diagnosis not present

## 2022-01-26 DIAGNOSIS — E349 Endocrine disorder, unspecified: Secondary | ICD-10-CM | POA: Diagnosis not present

## 2022-01-31 DIAGNOSIS — M47812 Spondylosis without myelopathy or radiculopathy, cervical region: Secondary | ICD-10-CM | POA: Diagnosis not present

## 2022-01-31 DIAGNOSIS — G894 Chronic pain syndrome: Secondary | ICD-10-CM | POA: Diagnosis not present

## 2022-01-31 DIAGNOSIS — M47816 Spondylosis without myelopathy or radiculopathy, lumbar region: Secondary | ICD-10-CM | POA: Diagnosis not present

## 2022-02-14 DIAGNOSIS — M47817 Spondylosis without myelopathy or radiculopathy, lumbosacral region: Secondary | ICD-10-CM | POA: Diagnosis not present

## 2022-02-25 DIAGNOSIS — E349 Endocrine disorder, unspecified: Secondary | ICD-10-CM | POA: Diagnosis not present

## 2022-03-25 DIAGNOSIS — E349 Endocrine disorder, unspecified: Secondary | ICD-10-CM | POA: Diagnosis not present

## 2022-04-06 DIAGNOSIS — Z0000 Encounter for general adult medical examination without abnormal findings: Secondary | ICD-10-CM | POA: Diagnosis not present

## 2022-04-06 DIAGNOSIS — Z299 Encounter for prophylactic measures, unspecified: Secondary | ICD-10-CM | POA: Diagnosis not present

## 2022-04-06 DIAGNOSIS — E349 Endocrine disorder, unspecified: Secondary | ICD-10-CM | POA: Diagnosis not present

## 2022-04-06 DIAGNOSIS — E7800 Pure hypercholesterolemia, unspecified: Secondary | ICD-10-CM | POA: Diagnosis not present

## 2022-04-06 DIAGNOSIS — R5383 Other fatigue: Secondary | ICD-10-CM | POA: Diagnosis not present

## 2022-04-06 DIAGNOSIS — F17210 Nicotine dependence, cigarettes, uncomplicated: Secondary | ICD-10-CM | POA: Diagnosis not present

## 2022-04-06 DIAGNOSIS — Z79899 Other long term (current) drug therapy: Secondary | ICD-10-CM | POA: Diagnosis not present

## 2022-04-25 DIAGNOSIS — E349 Endocrine disorder, unspecified: Secondary | ICD-10-CM | POA: Diagnosis not present

## 2022-05-03 DIAGNOSIS — G894 Chronic pain syndrome: Secondary | ICD-10-CM | POA: Diagnosis not present

## 2022-05-03 DIAGNOSIS — M545 Low back pain, unspecified: Secondary | ICD-10-CM | POA: Diagnosis not present

## 2022-05-03 DIAGNOSIS — Z5181 Encounter for therapeutic drug level monitoring: Secondary | ICD-10-CM | POA: Diagnosis not present

## 2022-05-03 DIAGNOSIS — M47816 Spondylosis without myelopathy or radiculopathy, lumbar region: Secondary | ICD-10-CM | POA: Diagnosis not present

## 2022-05-03 DIAGNOSIS — Z79899 Other long term (current) drug therapy: Secondary | ICD-10-CM | POA: Diagnosis not present

## 2022-05-03 DIAGNOSIS — M47812 Spondylosis without myelopathy or radiculopathy, cervical region: Secondary | ICD-10-CM | POA: Diagnosis not present

## 2022-05-26 DIAGNOSIS — E349 Endocrine disorder, unspecified: Secondary | ICD-10-CM | POA: Diagnosis not present

## 2022-06-27 DIAGNOSIS — F17210 Nicotine dependence, cigarettes, uncomplicated: Secondary | ICD-10-CM | POA: Diagnosis not present

## 2022-06-27 DIAGNOSIS — J449 Chronic obstructive pulmonary disease, unspecified: Secondary | ICD-10-CM | POA: Diagnosis not present

## 2022-06-27 DIAGNOSIS — Z7189 Other specified counseling: Secondary | ICD-10-CM | POA: Diagnosis not present

## 2022-06-27 DIAGNOSIS — Z0000 Encounter for general adult medical examination without abnormal findings: Secondary | ICD-10-CM | POA: Diagnosis not present

## 2022-06-27 DIAGNOSIS — E349 Endocrine disorder, unspecified: Secondary | ICD-10-CM | POA: Diagnosis not present

## 2022-06-27 DIAGNOSIS — Z299 Encounter for prophylactic measures, unspecified: Secondary | ICD-10-CM | POA: Diagnosis not present

## 2022-07-25 DIAGNOSIS — E349 Endocrine disorder, unspecified: Secondary | ICD-10-CM | POA: Diagnosis not present

## 2022-08-02 DIAGNOSIS — M961 Postlaminectomy syndrome, not elsewhere classified: Secondary | ICD-10-CM | POA: Diagnosis not present

## 2022-08-02 DIAGNOSIS — M545 Low back pain, unspecified: Secondary | ICD-10-CM | POA: Diagnosis not present

## 2022-08-02 DIAGNOSIS — G894 Chronic pain syndrome: Secondary | ICD-10-CM | POA: Diagnosis not present

## 2022-08-02 DIAGNOSIS — M47816 Spondylosis without myelopathy or radiculopathy, lumbar region: Secondary | ICD-10-CM | POA: Diagnosis not present

## 2022-08-02 DIAGNOSIS — M47812 Spondylosis without myelopathy or radiculopathy, cervical region: Secondary | ICD-10-CM | POA: Diagnosis not present

## 2022-08-25 DIAGNOSIS — E349 Endocrine disorder, unspecified: Secondary | ICD-10-CM | POA: Diagnosis not present

## 2022-09-26 DIAGNOSIS — E349 Endocrine disorder, unspecified: Secondary | ICD-10-CM | POA: Diagnosis not present

## 2022-10-28 DIAGNOSIS — E349 Endocrine disorder, unspecified: Secondary | ICD-10-CM | POA: Diagnosis not present

## 2022-11-01 DIAGNOSIS — Z79899 Other long term (current) drug therapy: Secondary | ICD-10-CM | POA: Diagnosis not present

## 2022-11-01 DIAGNOSIS — Z5181 Encounter for therapeutic drug level monitoring: Secondary | ICD-10-CM | POA: Diagnosis not present

## 2022-11-01 DIAGNOSIS — G894 Chronic pain syndrome: Secondary | ICD-10-CM | POA: Diagnosis not present

## 2022-11-01 DIAGNOSIS — M47816 Spondylosis without myelopathy or radiculopathy, lumbar region: Secondary | ICD-10-CM | POA: Diagnosis not present

## 2022-11-01 DIAGNOSIS — M47812 Spondylosis without myelopathy or radiculopathy, cervical region: Secondary | ICD-10-CM | POA: Diagnosis not present

## 2022-11-01 DIAGNOSIS — M545 Low back pain, unspecified: Secondary | ICD-10-CM | POA: Diagnosis not present

## 2022-11-28 DIAGNOSIS — E349 Endocrine disorder, unspecified: Secondary | ICD-10-CM | POA: Diagnosis not present

## 2022-12-13 DIAGNOSIS — Z299 Encounter for prophylactic measures, unspecified: Secondary | ICD-10-CM | POA: Diagnosis not present

## 2022-12-13 DIAGNOSIS — R109 Unspecified abdominal pain: Secondary | ICD-10-CM | POA: Diagnosis not present

## 2022-12-13 DIAGNOSIS — D692 Other nonthrombocytopenic purpura: Secondary | ICD-10-CM | POA: Diagnosis not present

## 2022-12-13 DIAGNOSIS — J449 Chronic obstructive pulmonary disease, unspecified: Secondary | ICD-10-CM | POA: Diagnosis not present

## 2022-12-13 DIAGNOSIS — D582 Other hemoglobinopathies: Secondary | ICD-10-CM | POA: Diagnosis not present

## 2022-12-29 DIAGNOSIS — E349 Endocrine disorder, unspecified: Secondary | ICD-10-CM | POA: Diagnosis not present

## 2023-01-27 DIAGNOSIS — E349 Endocrine disorder, unspecified: Secondary | ICD-10-CM | POA: Diagnosis not present

## 2023-01-31 DIAGNOSIS — M545 Low back pain, unspecified: Secondary | ICD-10-CM | POA: Diagnosis not present

## 2023-01-31 DIAGNOSIS — M47816 Spondylosis without myelopathy or radiculopathy, lumbar region: Secondary | ICD-10-CM | POA: Diagnosis not present

## 2023-01-31 DIAGNOSIS — G894 Chronic pain syndrome: Secondary | ICD-10-CM | POA: Diagnosis not present

## 2023-02-27 DIAGNOSIS — E349 Endocrine disorder, unspecified: Secondary | ICD-10-CM | POA: Diagnosis not present

## 2023-03-03 DIAGNOSIS — G4700 Insomnia, unspecified: Secondary | ICD-10-CM | POA: Diagnosis not present

## 2023-03-03 DIAGNOSIS — E349 Endocrine disorder, unspecified: Secondary | ICD-10-CM | POA: Diagnosis not present

## 2023-03-03 DIAGNOSIS — R5383 Other fatigue: Secondary | ICD-10-CM | POA: Diagnosis not present

## 2023-03-03 DIAGNOSIS — F17210 Nicotine dependence, cigarettes, uncomplicated: Secondary | ICD-10-CM | POA: Diagnosis not present

## 2023-03-03 DIAGNOSIS — Z0000 Encounter for general adult medical examination without abnormal findings: Secondary | ICD-10-CM | POA: Diagnosis not present

## 2023-03-03 DIAGNOSIS — Z299 Encounter for prophylactic measures, unspecified: Secondary | ICD-10-CM | POA: Diagnosis not present

## 2023-03-03 DIAGNOSIS — M545 Low back pain, unspecified: Secondary | ICD-10-CM | POA: Diagnosis not present

## 2023-03-06 DIAGNOSIS — R5383 Other fatigue: Secondary | ICD-10-CM | POA: Diagnosis not present

## 2023-03-06 DIAGNOSIS — Z79899 Other long term (current) drug therapy: Secondary | ICD-10-CM | POA: Diagnosis not present

## 2023-03-06 DIAGNOSIS — E7800 Pure hypercholesterolemia, unspecified: Secondary | ICD-10-CM | POA: Diagnosis not present

## 2023-03-06 DIAGNOSIS — E349 Endocrine disorder, unspecified: Secondary | ICD-10-CM | POA: Diagnosis not present

## 2023-03-15 DIAGNOSIS — M47816 Spondylosis without myelopathy or radiculopathy, lumbar region: Secondary | ICD-10-CM | POA: Diagnosis not present

## 2023-03-29 DIAGNOSIS — E349 Endocrine disorder, unspecified: Secondary | ICD-10-CM | POA: Diagnosis not present

## 2023-03-30 DIAGNOSIS — Z299 Encounter for prophylactic measures, unspecified: Secondary | ICD-10-CM | POA: Diagnosis not present

## 2023-03-30 DIAGNOSIS — K219 Gastro-esophageal reflux disease without esophagitis: Secondary | ICD-10-CM | POA: Diagnosis not present

## 2023-03-30 DIAGNOSIS — R0789 Other chest pain: Secondary | ICD-10-CM | POA: Diagnosis not present

## 2023-04-03 ENCOUNTER — Encounter (INDEPENDENT_AMBULATORY_CARE_PROVIDER_SITE_OTHER): Payer: Self-pay | Admitting: *Deleted

## 2023-05-04 DIAGNOSIS — G894 Chronic pain syndrome: Secondary | ICD-10-CM | POA: Diagnosis not present

## 2023-05-04 DIAGNOSIS — M47812 Spondylosis without myelopathy or radiculopathy, cervical region: Secondary | ICD-10-CM | POA: Diagnosis not present

## 2023-05-04 DIAGNOSIS — Z79899 Other long term (current) drug therapy: Secondary | ICD-10-CM | POA: Diagnosis not present

## 2023-05-04 DIAGNOSIS — Z5181 Encounter for therapeutic drug level monitoring: Secondary | ICD-10-CM | POA: Diagnosis not present

## 2023-05-04 DIAGNOSIS — M961 Postlaminectomy syndrome, not elsewhere classified: Secondary | ICD-10-CM | POA: Diagnosis not present

## 2023-05-04 DIAGNOSIS — M545 Low back pain, unspecified: Secondary | ICD-10-CM | POA: Diagnosis not present

## 2023-05-04 DIAGNOSIS — M47816 Spondylosis without myelopathy or radiculopathy, lumbar region: Secondary | ICD-10-CM | POA: Diagnosis not present

## 2023-05-04 DIAGNOSIS — M47817 Spondylosis without myelopathy or radiculopathy, lumbosacral region: Secondary | ICD-10-CM | POA: Diagnosis not present

## 2023-05-08 ENCOUNTER — Ambulatory Visit (INDEPENDENT_AMBULATORY_CARE_PROVIDER_SITE_OTHER): Payer: Medicare Other | Admitting: Gastroenterology

## 2023-05-10 DIAGNOSIS — E349 Endocrine disorder, unspecified: Secondary | ICD-10-CM | POA: Diagnosis not present

## 2023-06-06 DIAGNOSIS — R0981 Nasal congestion: Secondary | ICD-10-CM | POA: Diagnosis not present

## 2023-06-06 DIAGNOSIS — J069 Acute upper respiratory infection, unspecified: Secondary | ICD-10-CM | POA: Diagnosis not present

## 2023-06-06 DIAGNOSIS — R059 Cough, unspecified: Secondary | ICD-10-CM | POA: Diagnosis not present

## 2023-06-06 DIAGNOSIS — Z299 Encounter for prophylactic measures, unspecified: Secondary | ICD-10-CM | POA: Diagnosis not present

## 2023-06-21 DIAGNOSIS — E349 Endocrine disorder, unspecified: Secondary | ICD-10-CM | POA: Diagnosis not present

## 2023-06-29 DIAGNOSIS — G894 Chronic pain syndrome: Secondary | ICD-10-CM | POA: Diagnosis not present

## 2023-06-29 DIAGNOSIS — M47816 Spondylosis without myelopathy or radiculopathy, lumbar region: Secondary | ICD-10-CM | POA: Diagnosis not present

## 2023-06-29 DIAGNOSIS — M47812 Spondylosis without myelopathy or radiculopathy, cervical region: Secondary | ICD-10-CM | POA: Diagnosis not present

## 2023-06-29 DIAGNOSIS — M545 Low back pain, unspecified: Secondary | ICD-10-CM | POA: Diagnosis not present

## 2023-06-29 DIAGNOSIS — Z5181 Encounter for therapeutic drug level monitoring: Secondary | ICD-10-CM | POA: Diagnosis not present

## 2023-06-29 DIAGNOSIS — M961 Postlaminectomy syndrome, not elsewhere classified: Secondary | ICD-10-CM | POA: Diagnosis not present

## 2023-06-29 DIAGNOSIS — Z79899 Other long term (current) drug therapy: Secondary | ICD-10-CM | POA: Diagnosis not present

## 2023-06-30 DIAGNOSIS — Z0000 Encounter for general adult medical examination without abnormal findings: Secondary | ICD-10-CM | POA: Diagnosis not present

## 2023-06-30 DIAGNOSIS — Z7189 Other specified counseling: Secondary | ICD-10-CM | POA: Diagnosis not present

## 2023-06-30 DIAGNOSIS — Z299 Encounter for prophylactic measures, unspecified: Secondary | ICD-10-CM | POA: Diagnosis not present

## 2023-06-30 DIAGNOSIS — J449 Chronic obstructive pulmonary disease, unspecified: Secondary | ICD-10-CM | POA: Diagnosis not present

## 2023-08-03 DIAGNOSIS — E349 Endocrine disorder, unspecified: Secondary | ICD-10-CM | POA: Diagnosis not present

## 2023-08-24 DIAGNOSIS — M47816 Spondylosis without myelopathy or radiculopathy, lumbar region: Secondary | ICD-10-CM | POA: Diagnosis not present

## 2023-08-24 DIAGNOSIS — G894 Chronic pain syndrome: Secondary | ICD-10-CM | POA: Diagnosis not present

## 2023-08-24 DIAGNOSIS — Z79899 Other long term (current) drug therapy: Secondary | ICD-10-CM | POA: Diagnosis not present

## 2023-08-24 DIAGNOSIS — M961 Postlaminectomy syndrome, not elsewhere classified: Secondary | ICD-10-CM | POA: Diagnosis not present

## 2023-08-24 DIAGNOSIS — M545 Low back pain, unspecified: Secondary | ICD-10-CM | POA: Diagnosis not present

## 2023-08-24 DIAGNOSIS — Z5181 Encounter for therapeutic drug level monitoring: Secondary | ICD-10-CM | POA: Diagnosis not present

## 2023-11-16 DIAGNOSIS — M47812 Spondylosis without myelopathy or radiculopathy, cervical region: Secondary | ICD-10-CM | POA: Diagnosis not present

## 2023-11-16 DIAGNOSIS — M545 Low back pain, unspecified: Secondary | ICD-10-CM | POA: Diagnosis not present

## 2023-11-16 DIAGNOSIS — M961 Postlaminectomy syndrome, not elsewhere classified: Secondary | ICD-10-CM | POA: Diagnosis not present

## 2023-11-16 DIAGNOSIS — G894 Chronic pain syndrome: Secondary | ICD-10-CM | POA: Diagnosis not present

## 2023-11-16 DIAGNOSIS — M47816 Spondylosis without myelopathy or radiculopathy, lumbar region: Secondary | ICD-10-CM | POA: Diagnosis not present

## 2024-01-22 DIAGNOSIS — J069 Acute upper respiratory infection, unspecified: Secondary | ICD-10-CM | POA: Diagnosis not present

## 2024-01-22 DIAGNOSIS — K219 Gastro-esophageal reflux disease without esophagitis: Secondary | ICD-10-CM | POA: Diagnosis not present

## 2024-01-22 DIAGNOSIS — Z299 Encounter for prophylactic measures, unspecified: Secondary | ICD-10-CM | POA: Diagnosis not present

## 2024-01-22 DIAGNOSIS — R0981 Nasal congestion: Secondary | ICD-10-CM | POA: Diagnosis not present
